# Patient Record
Sex: Female | Born: 1960
Health system: Southern US, Community
[De-identification: ages and names within clinical notes are randomized; demographics above are authoritative.]

## PROBLEM LIST (undated history)

## (undated) DIAGNOSIS — E079 Disorder of thyroid, unspecified: Secondary | ICD-10-CM

## (undated) DIAGNOSIS — E119 Type 2 diabetes mellitus without complications: Secondary | ICD-10-CM

## (undated) DIAGNOSIS — E785 Hyperlipidemia, unspecified: Secondary | ICD-10-CM

## (undated) DIAGNOSIS — O009 Unspecified ectopic pregnancy without intrauterine pregnancy: Secondary | ICD-10-CM

## (undated) DIAGNOSIS — E039 Hypothyroidism, unspecified: Secondary | ICD-10-CM

## (undated) HISTORY — PX: NOSE SURGERY: SHX723

## (undated) HISTORY — DX: Hypothyroidism, unspecified: E03.9

## (undated) HISTORY — DX: Disorder of thyroid, unspecified: E07.9

## (undated) HISTORY — DX: Type 2 diabetes mellitus without complications: E11.9

## (undated) HISTORY — PX: COLONOSCOPY: SHX174

## (undated) HISTORY — DX: Unspecified ectopic pregnancy without intrauterine pregnancy: O00.90

## (undated) HISTORY — DX: Hyperlipidemia, unspecified: E78.5

## (undated) HISTORY — PX: RHINOPLASTY: SUR1284

## (undated) HISTORY — PX: TUBAL LIGATION: SHX77

## (undated) HISTORY — PX: PELVIC LAPAROSCOPY: SHX162

---

## 1997-09-08 ENCOUNTER — Other Ambulatory Visit: Admission: RE | Admit: 1997-09-08 | Discharge: 1997-09-08 | Payer: Self-pay | Admitting: Gynecology

## 1998-02-12 ENCOUNTER — Inpatient Hospital Stay (HOSPITAL_COMMUNITY): Admission: AD | Admit: 1998-02-12 | Discharge: 1998-02-12 | Payer: Self-pay | Admitting: Obstetrics and Gynecology

## 1998-02-14 ENCOUNTER — Observation Stay (HOSPITAL_COMMUNITY): Admission: AD | Admit: 1998-02-14 | Discharge: 1998-02-14 | Payer: Self-pay | Admitting: Obstetrics and Gynecology

## 1998-10-26 ENCOUNTER — Emergency Department (HOSPITAL_COMMUNITY): Admission: EM | Admit: 1998-10-26 | Discharge: 1998-10-26 | Payer: Self-pay | Admitting: *Deleted

## 1999-06-21 ENCOUNTER — Other Ambulatory Visit: Admission: RE | Admit: 1999-06-21 | Discharge: 1999-06-21 | Payer: Self-pay | Admitting: Gynecology

## 2000-10-26 ENCOUNTER — Other Ambulatory Visit: Admission: RE | Admit: 2000-10-26 | Discharge: 2000-10-26 | Payer: Self-pay | Admitting: Gynecology

## 2003-03-26 ENCOUNTER — Other Ambulatory Visit: Admission: RE | Admit: 2003-03-26 | Discharge: 2003-03-26 | Payer: Self-pay | Admitting: Gynecology

## 2009-04-08 ENCOUNTER — Ambulatory Visit: Payer: Self-pay | Admitting: Gynecology

## 2009-04-08 ENCOUNTER — Other Ambulatory Visit: Admission: RE | Admit: 2009-04-08 | Discharge: 2009-04-08 | Payer: Self-pay | Admitting: Gynecology

## 2010-05-24 ENCOUNTER — Emergency Department (HOSPITAL_COMMUNITY)
Admission: EM | Admit: 2010-05-24 | Discharge: 2010-05-24 | Payer: Self-pay | Source: Home / Self Care | Admitting: Emergency Medicine

## 2010-05-24 LAB — URINALYSIS, ROUTINE W REFLEX MICROSCOPIC
Bilirubin Urine: NEGATIVE
Ketones, ur: NEGATIVE mg/dL
Protein, ur: 30 mg/dL — AB
Urine Glucose, Fasting: NEGATIVE mg/dL

## 2010-05-24 LAB — POCT PREGNANCY, URINE: Preg Test, Ur: NEGATIVE

## 2010-05-24 LAB — URINE MICROSCOPIC-ADD ON

## 2010-05-27 LAB — URINE CULTURE
Colony Count: >=100000
Culture  Setup Time: 201202011127

## 2012-03-15 ENCOUNTER — Ambulatory Visit (INDEPENDENT_AMBULATORY_CARE_PROVIDER_SITE_OTHER): Payer: 59

## 2012-03-15 ENCOUNTER — Ambulatory Visit (INDEPENDENT_AMBULATORY_CARE_PROVIDER_SITE_OTHER): Payer: 59 | Admitting: Women's Health

## 2012-03-15 ENCOUNTER — Encounter: Payer: Self-pay | Admitting: Women's Health

## 2012-03-15 DIAGNOSIS — E039 Hypothyroidism, unspecified: Secondary | ICD-10-CM

## 2012-03-15 DIAGNOSIS — R1031 Right lower quadrant pain: Secondary | ICD-10-CM

## 2012-03-15 NOTE — Progress Notes (Addendum)
Patient ID: MILAYAH KRELL, female   DOB: [birth year], 51 y.o.   MRN: [id number] Presents with complaint of right lower quadrant pain started yesterday increased this a.m. and is less now. States slept well last night. States the pain with stabbing and intense earlier. Denies fever, nausea/ vomiting, change in bowel elimination or urinary symptoms. Cycles are monthly, currently on cycle, reports as heavier. New partner/widowed.  Exam: No CVAT, abdomen obese, no tenderness with palpation, no radiation, rebound noted. External genitalia slightly erythematous, speculum exam copious menses noted without odor GC/Chlamydia culture taken. Bimanual no CMT or tenderness. Ultrasound: 2 small fibroids 14 x 9 mm, 9 x 7 mm. Ovaries appear normal. No free fluid. Tolerated exam well.  Resolving right lower quadrant pain  Plan: Motrin as needed for pain. Increase rest, note written to miss work [employer], works 7 p to 7 a. Overdue for annual exam instructed to schedule with Dr. Audie Box. Followup if pain persists. Reviewed scar tissue type pain also possible from history 3 ectopic pregnancies.

## 2012-03-16 LAB — GC/CHLAMYDIA PROBE AMP, GENITAL: Chlamydia, DNA Probe: NEGATIVE

## 2012-04-02 ENCOUNTER — Ambulatory Visit (INDEPENDENT_AMBULATORY_CARE_PROVIDER_SITE_OTHER): Payer: 59 | Admitting: Gynecology

## 2012-04-02 ENCOUNTER — Encounter: Payer: Self-pay | Admitting: Gynecology

## 2012-04-02 ENCOUNTER — Other Ambulatory Visit (HOSPITAL_COMMUNITY)
Admission: RE | Admit: 2012-04-02 | Discharge: 2012-04-02 | Disposition: A | Discharge status: Home / Self Care | Payer: 59 | Source: Ambulatory Visit | Attending: Gynecology | Admitting: Gynecology

## 2012-04-02 VITALS — BP 124/78 | Ht 65.5 in | Wt 240.0 lb

## 2012-04-02 DIAGNOSIS — E039 Hypothyroidism, unspecified: Secondary | ICD-10-CM

## 2012-04-02 DIAGNOSIS — Z13220 Encounter for screening for lipoid disorders: Secondary | ICD-10-CM

## 2012-04-02 DIAGNOSIS — Z01419 Encounter for gynecological examination (general) (routine) without abnormal findings: Secondary | ICD-10-CM | POA: Insufficient documentation

## 2012-04-02 DIAGNOSIS — N946 Dysmenorrhea, unspecified: Secondary | ICD-10-CM

## 2012-04-02 DIAGNOSIS — Z1151 Encounter for screening for human papillomavirus (HPV): Secondary | ICD-10-CM | POA: Insufficient documentation

## 2012-04-02 LAB — CBC WITH DIFFERENTIAL/PLATELET
Basophils Relative: 0 % (ref 0–1)
Eosinophils Absolute: 0.2 10*3/uL (ref 0.0–0.7)
Hemoglobin: 11.6 g/dL — ABNORMAL LOW (ref 12.0–15.0)
Lymphs Abs: 2.5 10*3/uL (ref 0.7–4.0)
MCH: 24.4 pg — ABNORMAL LOW (ref 26.0–34.0)
MCHC: 32 g/dL (ref 30.0–36.0)
Monocytes Relative: 7 % (ref 3–12)
Neutro Abs: 5.9 10*3/uL (ref 1.7–7.7)
Neutrophils Relative %: 63 % (ref 43–77)
Platelets: 350 10*3/uL (ref 150–400)
RBC: 4.75 MIL/uL (ref 3.87–5.11)

## 2012-04-02 LAB — LIPID PANEL
LDL Cholesterol: 90 mg/dL (ref 0–99)
VLDL: 31 mg/dL (ref 0–40)

## 2012-04-02 LAB — COMPREHENSIVE METABOLIC PANEL
ALT: 20 U/L (ref 0–35)
Albumin: 3.9 g/dL (ref 3.5–5.2)
Alkaline Phosphatase: 65 U/L (ref 39–117)
Glucose, Bld: 82 mg/dL (ref 70–99)
Potassium: 4.3 mEq/L (ref 3.5–5.3)
Sodium: 138 mEq/L (ref 135–145)
Total Bilirubin: 0.4 mg/dL (ref 0.3–1.2)
Total Protein: 6.6 g/dL (ref 6.0–8.3)

## 2012-04-02 MED ORDER — IBUPROFEN 800 MG PO TABS: 800.0000 mg | ORAL_TABLET | Freq: Three times a day (TID) | ORAL | Status: DC | PRN

## 2012-04-02 NOTE — Addendum Note (Signed)
Addended by: Richardson Chiquito on: 04/02/2012 03:56 PM   Modules accepted: Orders

## 2012-04-02 NOTE — Progress Notes (Signed)
Elizabeth Farley 02-03-1961 [id number]        51 y.o.  G5P0050 for annual exam.  Several issues noted below.  Past medical history,surgical history, medications, allergies, family history and social history were all reviewed and documented in the EPIC chart. ROS:  Was performed and pertinent positives and negatives are included in the history.  Exam: Sherrilyn Rist assistant Filed Vitals:   04/02/12 1506  BP: 124/78  Height: 5' 5.5" (1.664 m)  Weight: 240 lb (108.863 kg)   General appearance  Normal Skin grossly normal Head/Neck normal with no cervical or supraclavicular adenopathy thyroid normal Lungs  clear Cardiac RR, without RMG Abdominal  soft, nontender, without masses, organomegaly or hernia Breasts  examined lying and sitting without masses, retractions, discharge or axillary adenopathy. Pelvic  Ext/BUS/vagina  normal   Cervix  normal Pap/HPV  Uterus  anteverted, normal size, shape and contour, midline and mobile nontender   Adnexa  Without masses or tenderness    Anus and perineum  normal   Rectovaginal  normal sphincter tone without palpated masses or tenderness.    Assessment/Plan:  51 y.o. G64P0050 female for annual exam, regular menses, tubal sterilization.   1. Last menses a little heavier, longer was a lot more dysmenorrhea. Otherwise regular menses.  Recent ultrasound November 2013 by Harriett Sine for right lower quadrant pain should 2 small myomas otherwise normal. No said her pain from then has disappeared. Check baseline FSH TSH otherwise keep menstrual calendar as long as normal regular menses we'll follow. If irregularity continues will represent for evaluation.  Motrin 800 mg #30 with 2 refill in the event that she has recurrent dysmenorrhea. 2. Mammogram 08/2010. Patient overdo and knows to schedule an agrees to do so. SBE monthly reviewed. 3. Pap smear 2010. Pap/HPV done today. No history of abnormal Pap smears previously. 4. Hypothyroid. Check a TSH today. 5. Health  maintenance. Baseline CBC urinalysis comprehensive metabolic panel lipid profile ordered with above blood work. Follow up as noted above. Otherwise annually    Dara Lords MD, 3:31 PM 04/02/2012

## 2012-04-02 NOTE — Patient Instructions (Signed)
Call to Schedule your mammogram  Facilities in [place]: 1)  [facility] [place], [place] Phone: [phone] 2)  [facility], [phone] Phone: [phone])  Dr. Yolanda Bonine at [facility] [address] Phone: [phone]     Mammogram A mammogram is an X-ray test to find changes in a woman's breast. You should get a mammogram if:  You are 38 years of age or older  You have risk factors.   Your doctor recommends that you have one.  BEFORE THE TEST  Do not schedule the test the week before your period, especially if your breasts are sore during this time.  On the day of your mammogram:  Wash your breasts and armpits well. After washing, do not put on any deodorant or talcum powder on until after your test.   Eat and drink as you usually do.   Take your medicines as usual.   If you are diabetic and take insulin, make sure you:   Eat before coming for your test.   Take your insulin as usual.   If you cannot keep your appointment, call before the appointment to cancel. Schedule another appointment.  TEST  You will need to undress from the waist up. You will put on a hospital gown.   Your breast will be put on the mammogram machine, and it will press firmly on your breast with a piece of plastic called a compression paddle. This will make your breast flatter so that the machine can X-ray all parts of your breast.   Both breasts will be X-rayed. Each breast will be X-rayed from above and from the side. An X-ray might need to be taken again if the picture is not good enough.   The mammogram will last about 15 to 30 minutes.  AFTER THE TEST Finding out the results of your test Ask when your test results will be ready. Make sure you get your test results.  Document Released: 07/07/2008 Document Revised: 03/30/2011 Document Reviewed: 07/07/2008 [facility] Patient  Information 2012 [place], [place].  Call if irregular menses continue. Follow up in one year for annual exam.

## 2012-04-03 ENCOUNTER — Telehealth: Payer: Self-pay | Admitting: Gynecology

## 2012-04-03 LAB — URINALYSIS W MICROSCOPIC + REFLEX CULTURE
Bacteria, UA: NONE SEEN
Bilirubin Urine: NEGATIVE
Glucose, UA: NEGATIVE mg/dL
Ketones, ur: NEGATIVE mg/dL
Protein, ur: NEGATIVE mg/dL
Urobilinogen, UA: 0.2 mg/dL (ref 0.0–1.0)

## 2012-04-03 LAB — FOLLICLE STIMULATING HORMONE: FSH: 2.6 m[IU]/mL

## 2012-04-03 LAB — TSH: TSH: 7.37 u[IU]/mL — ABNORMAL HIGH (ref 0.350–4.500)

## 2012-04-03 MED ORDER — LEVOTHYROXINE SODIUM 75 MCG PO TABS: 75.0000 ug | ORAL_TABLET | Freq: Every day | ORAL | Status: DC

## 2012-04-03 NOTE — Telephone Encounter (Signed)
Recommend she start on Synthroid 75 mcg daily and recheck TSH after 4-6 weeks. If she develops symptoms such as jitteriness, rapid heartbeat, shortness of breath, insomnia she needs to call.

## 2012-04-03 NOTE — Telephone Encounter (Signed)
Tell patient: 1. Hemoglobin is a little low I want her to start on a multivitamin with iron daily. I think this is due to her bleeding. 2. Her TSH shows she is hypothyroid. He she currently taking thyroid replacement? If not, what was we she taking before as we need to restart her on thyroid replacement.

## 2012-04-03 NOTE — Telephone Encounter (Signed)
Patient was informed.  Patient said over a year since she last took her thyroid medication.  She thinks it was" .25 or 25".

## 2012-04-04 ENCOUNTER — Other Ambulatory Visit: Payer: Self-pay | Admitting: Gynecology

## 2012-04-04 DIAGNOSIS — E039 Hypothyroidism, unspecified: Secondary | ICD-10-CM

## 2012-04-04 NOTE — Telephone Encounter (Signed)
Patient informed. 

## 2012-05-09 ENCOUNTER — Other Ambulatory Visit: Payer: 59

## 2012-05-09 DIAGNOSIS — E039 Hypothyroidism, unspecified: Secondary | ICD-10-CM

## 2012-05-09 LAB — TSH: TSH: 3.187 u[IU]/mL (ref 0.350–4.500)

## 2012-05-13 ENCOUNTER — Other Ambulatory Visit: Payer: Self-pay | Admitting: Gynecology

## 2012-05-13 DIAGNOSIS — E039 Hypothyroidism, unspecified: Secondary | ICD-10-CM

## 2014-02-23 ENCOUNTER — Encounter: Payer: Self-pay | Admitting: Gynecology

## 2014-07-09 ENCOUNTER — Encounter: Payer: Self-pay | Admitting: Gynecology

## 2014-07-09 ENCOUNTER — Ambulatory Visit (INDEPENDENT_AMBULATORY_CARE_PROVIDER_SITE_OTHER): Payer: 59 | Admitting: Gynecology

## 2014-07-09 VITALS — BP 124/78

## 2014-07-09 DIAGNOSIS — N921 Excessive and frequent menstruation with irregular cycle: Secondary | ICD-10-CM

## 2014-07-09 LAB — CBC WITH DIFFERENTIAL/PLATELET
BASOS ABS: 0.1 10*3/uL (ref 0.0–0.1)
BASOS PCT: 1 % (ref 0–1)
Eosinophils Absolute: 0.2 10*3/uL (ref 0.0–0.7)
Eosinophils Relative: 3 % (ref 0–5)
HCT: 40.3 % (ref 36.0–46.0)
HEMOGLOBIN: 12.9 g/dL (ref 12.0–15.0)
LYMPHS ABS: 2.1 10*3/uL (ref 0.7–4.0)
Lymphocytes Relative: 25 % (ref 12–46)
MCH: 25.4 pg — ABNORMAL LOW (ref 26.0–34.0)
MCHC: 32 g/dL (ref 30.0–36.0)
MCV: 79.3 fL (ref 78.0–100.0)
MONOS PCT: 6 % (ref 3–12)
MPV: 10.2 fL (ref 8.6–12.4)
Monocytes Absolute: 0.5 10*3/uL (ref 0.1–1.0)
NEUTROS ABS: 5.3 10*3/uL (ref 1.7–7.7)
NEUTROS PCT: 65 % (ref 43–77)
PLATELETS: 383 10*3/uL (ref 150–400)
RBC: 5.08 MIL/uL (ref 3.87–5.11)
RDW: 16.4 % — AB (ref 11.5–15.5)
WBC: 8.2 10*3/uL (ref 4.0–10.5)

## 2014-07-09 LAB — TSH: TSH: 4.066 u[IU]/mL (ref 0.350–4.500)

## 2014-07-09 MED ORDER — MEGESTROL ACETATE 20 MG PO TABS: 20.0000 mg | ORAL_TABLET | Freq: Every day | ORAL | Status: DC

## 2014-07-09 NOTE — Progress Notes (Signed)
Elizabeth Farley November 08, 1960 [id number]        54 y.o.  [id number] presents with a history of regular monthly menses until the beginning of February when she was due to start. She started spotting and spotted daily through yesterday when she started to bleed heavy with passage of clots and cramping. No bleeding in between or skips previously. No hot flushes, night sweats, vaginal dryness. No skin changes hair changes or weight changes. Is status post tubal sterilization in the past.  Past medical history,surgical history, problem list, medications, allergies, family history and social history were all reviewed and documented in the EPIC chart.  Directed ROS with pertinent positives and negatives documented in the history of present illness/assessment and plan.  [name] assistant Filed Vitals:   07/09/14 0934  BP: 124/78   General appearance:  Normal Abdomen soft nontender without masses guarding rebound Pelvic external BUS vagina with moderate menses flow. Cervix normal. Uterus grossly normal midline mobile nontender. Adnexa without masses or tenderness.  Assessment/Plan:  54 y.o. [id number] with dysfunctional bleeding in the peri-menopausal age group. Check baseline CBC, TSH, FSH. Schedule sonohysterogram to rule out nonpalpable abnormalities. Megace 20 mg twice a day times several days until bleeding slows then daily through ultrasound appointment. Follow up sooner if bleeding increases or other issues. Differential discussed with the patient to include dysfunctional bleeding with perimenopausal ovulatory irregularity versus structural abnormalities such as leiomyoma/polyps.     [name] MD, 9:48 AM 07/09/2014

## 2014-07-09 NOTE — Patient Instructions (Signed)
Take the Megace medication twice daily for several days until bleeding slows them once daily until the ultrasound appointment. Call sooner if bleeding becomes worse.

## 2014-07-10 ENCOUNTER — Other Ambulatory Visit: Payer: Self-pay | Admitting: Gynecology

## 2014-07-10 DIAGNOSIS — N921 Excessive and frequent menstruation with irregular cycle: Secondary | ICD-10-CM

## 2014-07-10 LAB — FOLLICLE STIMULATING HORMONE: FSH: 11.2 m[IU]/mL

## 2014-07-20 ENCOUNTER — Telehealth: Payer: Self-pay | Admitting: Gynecology

## 2014-07-20 NOTE — Telephone Encounter (Signed)
07/21/14-I LM VM for pt that I check benefits for the Sonohysterogram with her [facility] [employer] ins and they said covered at 100%, no copay or deductible/wl

## 2014-07-24 ENCOUNTER — Encounter: Payer: Self-pay | Admitting: Gynecology

## 2014-07-24 ENCOUNTER — Ambulatory Visit (INDEPENDENT_AMBULATORY_CARE_PROVIDER_SITE_OTHER): Payer: 59

## 2014-07-24 ENCOUNTER — Ambulatory Visit (INDEPENDENT_AMBULATORY_CARE_PROVIDER_SITE_OTHER): Payer: 59 | Admitting: Gynecology

## 2014-07-24 VITALS — BP 124/80

## 2014-07-24 DIAGNOSIS — N921 Excessive and frequent menstruation with irregular cycle: Secondary | ICD-10-CM | POA: Diagnosis not present

## 2014-07-24 DIAGNOSIS — N926 Irregular menstruation, unspecified: Secondary | ICD-10-CM

## 2014-07-24 DIAGNOSIS — N840 Polyp of corpus uteri: Secondary | ICD-10-CM

## 2014-07-24 NOTE — Patient Instructions (Signed)
Office will call you with biopsy results Office will call you to arrange for surgery

## 2014-07-24 NOTE — Progress Notes (Signed)
Elizabeth Farley Nov 19, 1960 [id number]        54 y.o.  [id number] presents for sonohysterogram due to irregular heavy bleeding. She was started on Megace which has stopped her bleeding. FSH was 11, TSH normal, hemoglobin 12.9.  Past medical history,surgical history, problem list, medications, allergies, family history and social history were all reviewed and documented in the EPIC chart.  Directed ROS with pertinent positives and negatives documented in the history of present illness/assessment and plan.  Exam: Sallee Lange assistant Filed Vitals:   07/24/14 1452  BP: 124/80   General appearance:  Normal Abdomen soft nontender without masses guarding rebound External BUS vagina normal. Cervix normal. Uterus grossly normal midline mobile. Adnexa without gross masses or tenderness  Ultrasound shows uterus normal size and echotexture.Several small myomas measuring 18 mm, 13 mm, 11 mm. Endometrial echo 13 mm. Right ovary normal. Left ovary with echo-free thin-walled avascular cyst 18 x 9 x 11 mm.  Sonohysterogram performed, sterile technique, easy catheter introduction, good distention with 10 x 9 x 10 mm right fundal polyp. Endometrial sample taken. Patient tolerated well.  Assessment/Plan:  54 y.o. [id number] with irregular heavy menses now stopped with Megace. Ultrasound shows multiple small myomas. Sonohysterogram shows clear fundal endometrial polyp.I reviewed the findings with the patient my recommendation to proceed with hysteroscopy D&C with resection of the endometrial polyp. I reviewed what is involved with the procedure to include the intraoperative and postoperative courses as well as the recovery period. Instrumentation including hysteroscopy resectoscope D&C portion were reviewed. The risks to include infection, prolonged antibiotics, hemorrhage necessitating transfusion and the risks of transfusion reviewed to include transfusion reaction, hepatitis, HIV, mad cow disease and other unknown  entities. The risk of uterine perforation and damage to internal organs including bowel, bladder, ureters, vessels, nerves either immediately recognized or delay recognized necessitating major exploratory reparative surgery and future reparative surgery including bowel resection bladder repair ureteral damage repair and ostomy formation was discussed. Distended media absorption leading to metabolic complications such as fluid overload, coma, seizures was all discussed. I did review with her that she is age 23 in the perimenopausal time frame and that there are no guarantees that this will resolve her irregular bleeding and then she may continue to bleed irregularly secondary to her perimenopausal status and that we may have to deal with this is a separate issue as time moves forward.  The patient's questions were answered to her satisfaction and she'll follow up to schedule. She is due for annual exam and we will do this preoperatively.     [name] MD, 3:20 PM 07/24/2014

## 2014-07-27 ENCOUNTER — Telehealth: Payer: Self-pay

## 2014-07-27 ENCOUNTER — Other Ambulatory Visit: Payer: Self-pay | Admitting: Gynecology

## 2014-07-27 MED ORDER — MISOPROSTOL 200 MCG PO TABS: ORAL_TABLET | ORAL | Status: DC

## 2014-07-27 NOTE — Telephone Encounter (Signed)
Spoke with patient and discussed ins benefits and estimated financial responsibility to [facility] preop.  Letter will be sent to her detailing this as well. Scheduled her for 08/18/14 at [facility].  CE was moved to before surgery and will serve as preop per Dr. Loetta Rough.  Discussed with patient the need for Cytotec vaginally hs before surgery and Rx was sent.

## 2014-07-28 ENCOUNTER — Encounter: Payer: Self-pay | Admitting: Gynecology

## 2014-08-04 ENCOUNTER — Telehealth: Payer: Self-pay

## 2014-08-04 NOTE — Telephone Encounter (Signed)
Left detailed message in patient's vmail.

## 2014-08-04 NOTE — Telephone Encounter (Signed)
Patient has her RGCE/pre op appt with you on Friday, 4/15 and Hyst D&C scheduled on 08/18/14.  She is still spotting/light bleeding and wondered what she should do? She's been on Megace in the past and wondered if she needed that?

## 2014-08-04 NOTE — Telephone Encounter (Signed)
I will see her on Friday and we'll talk about if she still bleeding.

## 2014-08-07 ENCOUNTER — Other Ambulatory Visit (HOSPITAL_COMMUNITY)
Admission: RE | Admit: 2014-08-07 | Discharge: 2014-08-07 | Disposition: A | Discharge status: Home / Self Care | Payer: 59 | Source: Ambulatory Visit | Attending: Gynecology | Admitting: Gynecology

## 2014-08-07 ENCOUNTER — Encounter (HOSPITAL_COMMUNITY)
Admission: RE | Admit: 2014-08-07 | Discharge: 2014-08-07 | Disposition: A | Discharge status: Home / Self Care | Payer: 59 | Source: Ambulatory Visit | Attending: Gynecology | Admitting: Gynecology

## 2014-08-07 ENCOUNTER — Encounter: Payer: Self-pay | Admitting: Gynecology

## 2014-08-07 ENCOUNTER — Ambulatory Visit (INDEPENDENT_AMBULATORY_CARE_PROVIDER_SITE_OTHER): Payer: 59 | Admitting: Gynecology

## 2014-08-07 ENCOUNTER — Encounter (HOSPITAL_COMMUNITY): Payer: Self-pay

## 2014-08-07 VITALS — BP 124/76 | Ht 66.0 in | Wt 250.0 lb

## 2014-08-07 DIAGNOSIS — N840 Polyp of corpus uteri: Secondary | ICD-10-CM

## 2014-08-07 DIAGNOSIS — Z01419 Encounter for gynecological examination (general) (routine) without abnormal findings: Secondary | ICD-10-CM | POA: Insufficient documentation

## 2014-08-07 DIAGNOSIS — N926 Irregular menstruation, unspecified: Secondary | ICD-10-CM

## 2014-08-07 DIAGNOSIS — Z01818 Encounter for other preprocedural examination: Secondary | ICD-10-CM | POA: Insufficient documentation

## 2014-08-07 LAB — CBC
HCT: 36.4 % (ref 36.0–46.0)
Hemoglobin: 11.7 g/dL — ABNORMAL LOW (ref 12.0–15.0)
MCH: 25.5 pg — ABNORMAL LOW (ref 26.0–34.0)
MCHC: 32.1 g/dL (ref 30.0–36.0)
MCV: 79.3 fL (ref 78.0–100.0)
PLATELETS: 324 10*3/uL (ref 150–400)
RBC: 4.59 MIL/uL (ref 3.87–5.11)
RDW: 16.2 % — ABNORMAL HIGH (ref 11.5–15.5)
WBC: 13.6 10*3/uL — AB (ref 4.0–10.5)

## 2014-08-07 LAB — COMPREHENSIVE METABOLIC PANEL
ALT: 23 U/L (ref 0–35)
AST: 20 U/L (ref 0–37)
Albumin: 3.7 g/dL (ref 3.5–5.2)
Alkaline Phosphatase: 74 U/L (ref 39–117)
BUN: 7 mg/dL (ref 6–23)
CHLORIDE: 104 meq/L (ref 96–112)
CO2: 24 meq/L (ref 19–32)
CREATININE: 0.72 mg/dL (ref 0.50–1.10)
Calcium: 9 mg/dL (ref 8.4–10.5)
Glucose, Bld: 87 mg/dL (ref 70–99)
POTASSIUM: 4.5 meq/L (ref 3.5–5.3)
Sodium: 136 mEq/L (ref 135–145)
Total Bilirubin: 0.4 mg/dL (ref 0.2–1.2)
Total Protein: 6.6 g/dL (ref 6.0–8.3)

## 2014-08-07 LAB — LIPID PANEL
CHOLESTEROL: 147 mg/dL (ref 0–200)
HDL: 38 mg/dL — AB (ref >=46)
LDL CALC: 87 mg/dL (ref 0–99)
TRIGLYCERIDES: 111 mg/dL (ref <150)
Total CHOL/HDL Ratio: 3.9 Ratio
VLDL: 22 mg/dL (ref 0–40)

## 2014-08-07 MED ORDER — MEGESTROL ACETATE 20 MG PO TABS: 20.0000 mg | ORAL_TABLET | Freq: Every day | ORAL | Status: DC

## 2014-08-07 NOTE — Progress Notes (Signed)
Elizabeth Farley 11-May-1960 [id number]        54 y.o.  [id number] for annual exam.  Several issues noted below.  Past medical history,surgical history, problem list, medications, allergies, family history and social history were all reviewed and documented as reviewed in the EPIC chart.  ROS:  Performed with pertinent positives and negatives included in the history, assessment and plan.   Additional significant findings :  none   Exam: [name] Counsellor Vitals:   08/07/14 0947  BP: 124/76  Height: 5\' 6"  (1.676 m)  Weight: 250 lb (113.399 kg)   General appearance:  Normal affect, orientation and appearance. Skin: Grossly normal HEENT: Without gross lesions.  No cervical or supraclavicular adenopathy. Thyroid normal.  Lungs:  Clear without wheezing, rales or rhonchi Cardiac: RR, without RMG Abdominal:  Soft, nontender, without masses, guarding, rebound, organomegaly or hernia Breasts:  Examined lying and sitting without masses, retractions, discharge or axillary adenopathy. Pelvic:  Ext/BUS/vagina with menses type flow  Cervix normal. Pap smear done  Uterus axial to anteverted, difficult to clearly palpate but grossly normal size, midline and mobile nontender   Adnexa  Without gross masses or tenderness    Anus and perineum  Normal   Rectovaginal  Normal sphincter tone without palpated masses or tenderness.    Assessment/Plan:  54 y.o. G28P0050 female for annual exam.   1. Irregular menses. Recently was evaluated for irregular bleeding with normal FSH TSH and hemoglobin of 12. Ultrasound showed several small myomas measuring 18, 13 and 11 mm. Sonohysterogram showed a 10 x 9 x 10 mm right fundal polyp. Endometrial biopsy was negative. Patient is scheduled for hysteroscopy D&C with resection of her endometrial polyp 26 April.  I have reviewed what is involved with the procedure to include the intraoperative and postoperative courses as well as the recovery period. Instrumentation including  hysteroscopy, resectoscope, D&C portion were reviewed. The risks to include infection, prolonged antibiotics, hemorrhage necessitating transfusion and the risks of transfusion reviewed to include transfusion reaction, hepatitis, HIV, mad cow disease and other unknown entities. The risk of uterine perforation and damage to internal organs including bowel, bladder, ureters, vessels, nerves either immediately recognized or delay recognized necessitating major exploratory reparative surgery and future reparative surgery including bowel resection bladder repair ureteral damage repair and ostomy formation was discussed. Distended media absorption leading to metabolic complications such as fluid overload, coma, seizures was all discussed. I did review with her that she is age 63 in the perimenopausal time frame and that there are no guarantees that this will resolve her irregular bleeding and then she may continue to bleed irregularly secondary to her perimenopausal status and that we may have to deal with this is a separate issue as time moves forward.  The patient's questions were answered to her satisfaction and she'll follow up to schedule.   She is continuing to bleed irregularly I placed her on Megace 20 mg daily through the procedure to hopefully slow down/stop her bleeding. 2. Pap smear/HPV negative 2013. Pap smear done today. No history of significant abnormal Pap smears. 3. Mammography overdue by several years. Strongly recommended patient schedule when she agrees to do so. Names and numbers provided. SBE monthly reviewed. 4. Colonoscopy never. Recommended screening colonoscopy as she has turned 64 and she acknowledges my recommendation. 5. Health maintenance. Has not had blood work done in some time. Baseline comprehensive metabolic panel, lipid profile, urinalysis, vitamin D ordered. Recent CBC and TSH were normal. Follow up for surgery otherwise 1  year for annual exam.     [name] MD, 10:21  AM 08/07/2014

## 2014-08-07 NOTE — Patient Instructions (Addendum)
   Your procedure is scheduled on: April 26 at [place] through the [facility] at: [place] up the phone at the desk and dial [phone] and inform us of your arrival.  Please call this number if you have any problems the morning of surgery: [phone]  Remember: Do not eat food or drink liquids after midnight: Monday ( April 25 )   Take these medicines the morning of surgery with a SIP OF WATER:  Do not wear jewelry or, make-up. No metal in your hair or on your body. Do not wear lotions, powders, perfumes.  You may wear deodorant.  Do not bring valuables to the hospital. Contacts, dentures or bridgework may not be worn into surgery.     Patients discharged on the day of surgery will not be allowed to drive home.

## 2014-08-07 NOTE — Patient Instructions (Signed)
Follow up for surgery as scheduled.  Call to Schedule your mammogram  Facilities in [place]: 1)  [facility], [place] Phone: [phone] 2)  [facility]. [address] [employer] [place] Phone: [phone] 3)  Dr. Isaiah Blakes at [facility] [address] Phone: [phone]     Mammogram A mammogram is an X-ray test to find changes in a woman's breast. You should get a mammogram if:  You are 60 years of age or older  You have risk factors.   Your doctor recommends that you have one.  BEFORE THE TEST  Do not schedule the test the week before your period, especially if your breasts are sore during this time.  On the day of your mammogram:  Wash your breasts and armpits well. After washing, do not put on any deodorant or talcum powder on until after your test.   Eat and drink as you usually do.   Take your medicines as usual.   If you are diabetic and take insulin, make sure you:   Eat before coming for your test.   Take your insulin as usual.   If you cannot keep your appointment, call before the appointment to cancel. Schedule another appointment.  TEST  You will need to undress from the waist up. You will put on a hospital gown.   Your breast will be put on the mammogram machine, and it will press firmly on your breast with a piece of plastic called a compression paddle. This will make your breast flatter so that the machine can X-ray all parts of your breast.   Both breasts will be X-rayed. Each breast will be X-rayed from above and from the side. An X-ray might need to be taken again if the picture is not good enough.   The mammogram will last about 15 to 30 minutes.  AFTER THE TEST Finding out the results of your test Ask when your test results will be ready. Make sure you get your test results.  Document Released: 07/07/2008 Document Revised: 03/30/2011 Document  Reviewed: 07/07/2008 [facility] Patient Information 2012 [place].  You may obtain a copy of any labs that were done today by logging onto MyChart as outlined in the instructions provided with your AVS (after visit summary). The office will not call with normal lab results but certainly if there are any significant abnormalities then we will contact you.   Health Maintenance, Female A healthy lifestyle and preventative care can promote health and wellness.  Maintain regular health, dental, and eye exams.  Eat a healthy diet. Foods like vegetables, fruits, whole grains, low-fat dairy products, and lean protein foods contain the nutrients you need without too many calories. Decrease your intake of foods high in solid fats, added sugars, and salt. Get information about a proper diet from your caregiver, if necessary.  Regular physical exercise is one of the most important things you can do for your health. Most adults should get at least 150 minutes of moderate-intensity exercise (any activity that increases your heart rate and causes you to sweat) each week. In addition, most adults need muscle-strengthening exercises on 2 or more days a week.   Maintain a healthy weight. The body mass index (BMI) is a screening tool to identify possible weight problems. It provides an estimate of body fat based on height and weight. Your caregiver can help determine your BMI, and can help you achieve  or maintain a healthy weight. For adults 20 years and older:  A BMI below 18.5 is considered underweight.  A BMI of 18.5 to 24.9 is normal.  A BMI of 25 to 29.9 is considered overweight.  A BMI of 30 and above is considered obese.  Maintain normal blood lipids and cholesterol by exercising and minimizing your intake of saturated fat. Eat a balanced diet with plenty of fruits and vegetables. Blood tests for lipids and cholesterol should begin at age 36 and be repeated every 5 years. If your lipid or  cholesterol levels are high, you are over 50, or you are a high risk for heart disease, you may need your cholesterol levels checked more frequently.Ongoing high lipid and cholesterol levels should be treated with medicines if diet and exercise are not effective.  If you smoke, find out from your caregiver how to quit. If you do not use tobacco, do not start.  Lung cancer screening is recommended for adults aged 36 80 years who are at high risk for developing lung cancer because of a history of smoking. Yearly low-dose computed tomography (CT) is recommended for people who have at least a 30-pack-year history of smoking and are a current smoker or have quit within the past 15 years. A pack year of smoking is smoking an average of 1 pack of cigarettes a day for 1 year (for example: 1 pack a day for 30 years or 2 packs a day for 15 years). Yearly screening should continue until the smoker has stopped smoking for at least 15 years. Yearly screening should also be stopped for people who develop a health problem that would prevent them from having lung cancer treatment.  If you are pregnant, do not drink alcohol. If you are breastfeeding, be very cautious about drinking alcohol. If you are not pregnant and choose to drink alcohol, do not exceed 1 drink per day. One drink is considered to be 12 ounces (355 mL) of beer, 5 ounces (148 mL) of wine, or 1.5 ounces (44 mL) of liquor.  Avoid use of street drugs. Do not share needles with anyone. Ask for help if you need support or instructions about stopping the use of drugs.  High blood pressure causes heart disease and increases the risk of stroke. Blood pressure should be checked at least every 1 to 2 years. Ongoing high blood pressure should be treated with medicines, if weight loss and exercise are not effective.  If you are 83 to 52 years old, ask your caregiver if you should take aspirin to prevent strokes.  Diabetes screening involves taking a blood sample  to check your fasting blood sugar level. This should be done once every 3 years, after age 26, if you are within normal weight and without risk factors for diabetes. Testing should be considered at a younger age or be carried out more frequently if you are overweight and have at least 1 risk factor for diabetes.  Breast cancer screening is essential preventative care for women. You should practice "breast self-awareness." This means understanding the normal appearance and feel of your breasts and may include breast self-examination. Any changes detected, no matter how small, should be reported to a caregiver. Women in their 70s and 30s should have a clinical breast exam (CBE) by a caregiver as part of a regular health exam every 1 to 3 years. After age 40, women should have a CBE every year. Starting at age 52, women should consider having a mammogram (breast  X-ray) every year. Women who have a family history of breast cancer should talk to their caregiver about genetic screening. Women at a high risk of breast cancer should talk to their caregiver about having an MRI and a mammogram every year.  Breast cancer gene (BRCA)-related cancer risk assessment is recommended for women who have family members with BRCA-related cancers. BRCA-related cancers include breast, ovarian, tubal, and peritoneal cancers. Having family members with these cancers may be associated with an increased risk for harmful changes (mutations) in the breast cancer genes BRCA1 and BRCA2. Results of the assessment will determine the need for genetic counseling and BRCA1 and BRCA2 testing.  The Pap test is a screening test for cervical cancer. Women should have a Pap test starting at age 28. Between ages 30 and 28, Pap tests should be repeated every 2 years. Beginning at age 15, you should have a Pap test every 3 years as long as the past 3 Pap tests have been normal. If you had a hysterectomy for a problem that was not cancer or a condition  that could lead to cancer, then you no longer need Pap tests. If you are between ages 27 and 56, and you have had normal Pap tests going back 10 years, you no longer need Pap tests. If you have had past treatment for cervical cancer or a condition that could lead to cancer, you need Pap tests and screening for cancer for at least 20 years after your treatment. If Pap tests have been discontinued, risk factors (such as a new sexual partner) need to be reassessed to determine if screening should be resumed. Some women have medical problems that increase the chance of getting cervical cancer. In these cases, your caregiver may recommend more frequent screening and Pap tests.  The human papillomavirus (HPV) test is an additional test that may be used for cervical cancer screening. The HPV test looks for the virus that can cause the cell changes on the cervix. The cells collected during the Pap test can be tested for HPV. The HPV test could be used to screen women aged 30 years and older, and should be used in women of any age who have unclear Pap test results. After the age of 77, women should have HPV testing at the same frequency as a Pap test.  Colorectal cancer can be detected and often prevented. Most routine colorectal cancer screening begins at the age of 2 and continues through age 1. However, your caregiver may recommend screening at an earlier age if you have risk factors for colon cancer. On a yearly basis, your caregiver may provide home test kits to check for hidden blood in the stool. Use of a small camera at the end of a tube, to directly examine the colon (sigmoidoscopy or colonoscopy), can detect the earliest forms of colorectal cancer. Talk to your caregiver about this at age 60, when routine screening begins. Direct examination of the colon should be repeated every 5 to 10 years through age 66, unless early forms of pre-cancerous polyps or small growths are found.  Hepatitis C blood testing is  recommended for all people born from [birth year] through 1963 and any individual with known risks for hepatitis C.  Practice safe sex. Use condoms and avoid high-risk sexual practices to reduce the spread of sexually transmitted infections (STIs). Sexually active women aged 35 and younger should be checked for Chlamydia, which is a common sexually transmitted infection. Older women with new or multiple partners should  also be tested for Chlamydia. Testing for other STIs is recommended if you are sexually active and at increased risk.  Osteoporosis is a disease in which the bones lose minerals and strength with aging. This can result in serious bone fractures. The risk of osteoporosis can be identified using a bone density scan. Women ages 1 and over and women at risk for fractures or osteoporosis should discuss screening with their caregivers. Ask your caregiver whether you should be taking a calcium supplement or vitamin D to reduce the rate of osteoporosis.  Menopause can be associated with physical symptoms and risks. Hormone replacement therapy is available to decrease symptoms and risks. You should talk to your caregiver about whether hormone replacement therapy is right for you.  Use sunscreen. Apply sunscreen liberally and repeatedly throughout the day. You should seek shade when your shadow is shorter than you. Protect yourself by wearing long sleeves, pants, a wide-brimmed hat, and sunglasses year round, whenever you are outdoors.  Notify your caregiver of new moles or changes in moles, especially if there is a change in shape or color. Also notify your caregiver if a mole is larger than the size of a pencil eraser.  Stay current with your immunizations. Document Released: 10/24/2010 Document Revised: 08/05/2012 Document Reviewed: 10/24/2010 [facility] Patient Information 2014 [place].

## 2014-08-07 NOTE — Addendum Note (Signed)
Addended by: Nelva Nay on: 08/07/2014 10:33 AM   Modules accepted: Orders

## 2014-08-07 NOTE — H&P (Signed)
  Elizabeth Farley Feb 07, 1961 [id number]   History and Physical  Chief complaint: irregular bleeding, endometrial polyp  History of present illness: 54 y.o. [id number] with heavy irregular bleeding starting February 2016 after regular monthly menses receding. Evaluation included a hemoglobin of 12, FSH and TSH normal and sonohysterogram showing multiple small myomas 52, 13 and 11 mm. It also showed a 10 x 9 x 10 mm right fundal polyp with a negative endometrial biopsy. Patient is admitted for hysteroscopy D&C with resection of the endometrial polyp.  Past medical history,surgical history, medications, allergies, family history and social history were all reviewed and documented in the EPIC chart.  ROS:  Was performed and pertinent positives and negatives are included in the history of present illness.  Exam:  [name] assistant 08/07/2014 General: well developed, well nourished female, no acute distress HEENT: normal  Lungs: clear to auscultation without wheezing, rales or rhonchi  Cardiac: regular rate without rubs, murmurs or gallops  Abdomen: soft, nontender without masses, guarding, rebound, organomegaly  Pelvic: external bus vagina: normal   Cervix: grossly normal  Uterus: grossly normal size, midline and mobile, nontender  Adnexa: without gross masses or tenderness  Rectovaginal exam within normal limits    Assessment/Plan:  54 y.o. [id number] with recent heavy irregular bleeding.  Evaluation includes a normal FSH TSH and hemoglobin of 12. Ultrasound showed several small myomas measuring 52, 13 and 11 mm. Sonohysterogram showed a 10 x 9 x 10 mm right fundal polyp. Endometrial biopsy was negative. Patient is scheduled for hysteroscopy D&C with resection of her endometrial polyp.  I have reviewed what is involved with the procedure to include the intraoperative and postoperative courses as well as the recovery period. Instrumentation including hysteroscopy, resectoscope, D&C portion were reviewed. The  risks to include infection, prolonged antibiotics, hemorrhage necessitating transfusion and the risks of transfusion reviewed to include transfusion reaction, hepatitis, HIV, mad cow disease and other unknown entities. The risk of uterine perforation and damage to internal organs including bowel, bladder, ureters, vessels, nerves either immediately recognized or delay recognized necessitating major exploratory reparative surgery and future reparative surgery including bowel resection bladder repair ureteral damage repair and ostomy formation was discussed. Distended media absorption leading to metabolic complications such as fluid overload, coma, seizures was all discussed. I did review with her that she is age 54 in the perimenopausal time frame and that there are no guarantees that this will resolve her irregular bleeding and then she may continue to bleed irregularly secondary to her perimenopausal status and that we may have to deal with this is a separate issue as time moves forward.  The patient's questions were answered to her satisfaction and she'll follow up to schedule.   She has continued to bleed irregularly and I placed her on Megace 20 mg daily through the procedure to hopefully slow down/stop her bleeding.    [name] MD, 10:30 AM 08/07/2014

## 2014-08-08 LAB — URINALYSIS W MICROSCOPIC + REFLEX CULTURE
BACTERIA UA: NONE SEEN
Bilirubin Urine: NEGATIVE
CASTS: NONE SEEN
CRYSTALS: NONE SEEN
Glucose, UA: NEGATIVE mg/dL
Ketones, ur: NEGATIVE mg/dL
Nitrite: NEGATIVE
Protein, ur: NEGATIVE mg/dL
SQUAMOUS EPITHELIAL / LPF: NONE SEEN
Specific Gravity, Urine: 1.022 (ref 1.005–1.030)
UROBILINOGEN UA: 1 mg/dL (ref 0.0–1.0)
pH: 6 (ref 5.0–8.0)

## 2014-08-08 LAB — VITAMIN D 25 HYDROXY (VIT D DEFICIENCY, FRACTURES): VIT D 25 HYDROXY: 15 ng/mL — AB (ref 30–100)

## 2014-08-10 LAB — CYTOLOGY - PAP

## 2014-08-10 LAB — URINE CULTURE: Colony Count: 35000

## 2014-08-11 ENCOUNTER — Other Ambulatory Visit: Payer: Self-pay | Admitting: Gynecology

## 2014-08-11 DIAGNOSIS — E559 Vitamin D deficiency, unspecified: Secondary | ICD-10-CM

## 2014-08-12 ENCOUNTER — Telehealth: Payer: Self-pay

## 2014-08-12 NOTE — Telephone Encounter (Signed)
I called patient to let her know surgery case in front of her's cancelled and I would like to move her up to the 9:30am spot. She was agreeable. She was instructed to check in at 8:00am at [facility].

## 2014-08-16 NOTE — Anesthesia Preprocedure Evaluation (Addendum)
Anesthesia Evaluation  Patient identified by MRN, date of birth, ID band Patient awake    Reviewed: Allergy & Precautions, NPO status , Patient's Chart, lab work & pertinent test results, reviewed documented beta blocker date and time   Airway Mallampati: II   Neck ROM: Full    Dental  (+) Teeth Intact, Dental Advisory Given   Pulmonary neg pulmonary ROS,  breath sounds clear to auscultation        Cardiovascular negative cardio ROS  Rhythm:Regular     Neuro/Psych negative neurological ROS  negative psych ROS   GI/Hepatic negative GI ROS, Neg liver ROS,   Endo/Other  Hypothyroidism Morbid obesity  Renal/GU      Musculoskeletal   Abdominal (+) + obese,   Peds  Hematology 12/36   Anesthesia Other Findings   Reproductive/Obstetrics                            Anesthesia Physical Anesthesia Plan  ASA: II  Anesthesia Plan: General   Post-op Pain Management:    Induction: Intravenous and Rapid sequence  Airway Management Planned: Oral ETT  Additional Equipment:   Intra-op Plan:   Post-operative Plan: Extubation in OR  Informed Consent: I have reviewed the patients History and Physical, chart, labs and discussed the procedure including the risks, benefits and alternatives for the proposed anesthesia with the patient or authorized representative who has indicated his/her understanding and acceptance.     Plan Discussed with:   Anesthesia Plan Comments: (Glide available)        Anesthesia Quick Evaluation

## 2014-08-17 MED ORDER — DEXTROSE 5 % IV SOLN
2.0000 g | INTRAVENOUS | Status: DC
  Filled 2014-08-17: qty 2

## 2014-08-18 ENCOUNTER — Encounter (HOSPITAL_COMMUNITY)
Admission: RE | Disposition: A | Discharge status: Home / Self Care | Payer: Self-pay | Source: Ambulatory Visit | Attending: Gynecology

## 2014-08-18 ENCOUNTER — Ambulatory Visit (HOSPITAL_COMMUNITY): Payer: 59 | Admitting: Anesthesiology

## 2014-08-18 ENCOUNTER — Ambulatory Visit (HOSPITAL_COMMUNITY)
Admission: RE | Admit: 2014-08-18 | Discharge: 2014-08-18 | Disposition: A | Discharge status: Home / Self Care | Payer: 59 | Source: Ambulatory Visit | Attending: Gynecology | Admitting: Gynecology

## 2014-08-18 DIAGNOSIS — E039 Hypothyroidism, unspecified: Secondary | ICD-10-CM | POA: Diagnosis not present

## 2014-08-18 DIAGNOSIS — N926 Irregular menstruation, unspecified: Secondary | ICD-10-CM | POA: Diagnosis not present

## 2014-08-18 DIAGNOSIS — N840 Polyp of corpus uteri: Secondary | ICD-10-CM | POA: Insufficient documentation

## 2014-08-18 HISTORY — PX: DILATATION & CURETTAGE/HYSTEROSCOPY WITH MYOSURE: SHX6511

## 2014-08-18 LAB — PREGNANCY, URINE: PREG TEST UR: NEGATIVE

## 2014-08-18 SURGERY — DILATATION & CURETTAGE/HYSTEROSCOPY WITH MYOSURE
Anesthesia: General | Site: Uterus

## 2014-08-18 MED ORDER — MIDAZOLAM HCL 2 MG/2ML IJ SOLN
INTRAMUSCULAR | Status: DC | PRN
  Administered 2014-08-18: 1 mg via INTRAVENOUS

## 2014-08-18 MED ORDER — DEXAMETHASONE SODIUM PHOSPHATE 10 MG/ML IJ SOLN
INTRAMUSCULAR | Status: DC | PRN
  Administered 2014-08-18: 10 mg via INTRAVENOUS

## 2014-08-18 MED ORDER — KETOROLAC TROMETHAMINE 30 MG/ML IJ SOLN
INTRAMUSCULAR | Status: AC
  Filled 2014-08-18: qty 1

## 2014-08-18 MED ORDER — SUCCINYLCHOLINE CHLORIDE 20 MG/ML IJ SOLN
INTRAMUSCULAR | Status: DC | PRN
  Administered 2014-08-18: 160 mg via INTRAVENOUS

## 2014-08-18 MED ORDER — ONDANSETRON HCL 4 MG/2ML IJ SOLN
INTRAMUSCULAR | Status: AC
  Filled 2014-08-18: qty 2

## 2014-08-18 MED ORDER — NEOSTIGMINE METHYLSULFATE 10 MG/10ML IV SOLN
INTRAVENOUS | Status: AC
  Filled 2014-08-18: qty 1

## 2014-08-18 MED ORDER — DEXAMETHASONE SODIUM PHOSPHATE 10 MG/ML IJ SOLN
INTRAMUSCULAR | Status: AC
  Filled 2014-08-18: qty 1

## 2014-08-18 MED ORDER — NEOSTIGMINE METHYLSULFATE 10 MG/10ML IV SOLN
INTRAVENOUS | Status: DC | PRN
  Administered 2014-08-18: 3 mg via INTRAVENOUS

## 2014-08-18 MED ORDER — SCOPOLAMINE 1 MG/3DAYS TD PT72: 1.0000 | MEDICATED_PATCH | Freq: Once | TRANSDERMAL | Status: DC

## 2014-08-18 MED ORDER — MIDAZOLAM HCL 2 MG/2ML IJ SOLN
INTRAMUSCULAR | Status: AC
  Filled 2014-08-18: qty 2

## 2014-08-18 MED ORDER — LIDOCAINE HCL 1 % IJ SOLN
INTRAMUSCULAR | Status: DC | PRN
  Administered 2014-08-18: 10 mL

## 2014-08-18 MED ORDER — PROPOFOL 10 MG/ML IV BOLUS
INTRAVENOUS | Status: AC
  Filled 2014-08-18: qty 20

## 2014-08-18 MED ORDER — ONDANSETRON HCL 4 MG/2ML IJ SOLN
INTRAMUSCULAR | Status: DC | PRN
  Administered 2014-08-18: 4 mg via INTRAVENOUS

## 2014-08-18 MED ORDER — SODIUM CHLORIDE 0.9 % IR SOLN
Status: DC | PRN
  Administered 2014-08-18: 3000 mL

## 2014-08-18 MED ORDER — KETOROLAC TROMETHAMINE 30 MG/ML IJ SOLN
INTRAMUSCULAR | Status: DC | PRN
  Administered 2014-08-18: 30 mg via INTRAVENOUS

## 2014-08-18 MED ORDER — LIDOCAINE HCL 1 % IJ SOLN
INTRAMUSCULAR | Status: AC
  Filled 2014-08-18: qty 20

## 2014-08-18 MED ORDER — PROPOFOL 10 MG/ML IV BOLUS
INTRAVENOUS | Status: DC | PRN
  Administered 2014-08-18: 200 mg via INTRAVENOUS

## 2014-08-18 MED ORDER — GLYCOPYRROLATE 0.2 MG/ML IJ SOLN
INTRAMUSCULAR | Status: AC
  Filled 2014-08-18: qty 3

## 2014-08-18 MED ORDER — LACTATED RINGERS IV SOLN
INTRAVENOUS | Status: DC
  Administered 2014-08-18: 08:00:00 via INTRAVENOUS

## 2014-08-18 MED ORDER — FENTANYL CITRATE (PF) 100 MCG/2ML IJ SOLN
INTRAMUSCULAR | Status: DC | PRN
  Administered 2014-08-18: 100 ug via INTRAVENOUS

## 2014-08-18 MED ORDER — ROCURONIUM BROMIDE 100 MG/10ML IV SOLN
INTRAVENOUS | Status: DC | PRN
  Administered 2014-08-18: 20 mg via INTRAVENOUS

## 2014-08-18 MED ORDER — LIDOCAINE HCL (CARDIAC) 20 MG/ML IV SOLN
INTRAVENOUS | Status: DC | PRN
  Administered 2014-08-18: 100 mg via INTRAVENOUS

## 2014-08-18 MED ORDER — SUCCINYLCHOLINE CHLORIDE 20 MG/ML IJ SOLN
INTRAMUSCULAR | Status: AC
  Filled 2014-08-18: qty 10

## 2014-08-18 MED ORDER — FENTANYL CITRATE (PF) 100 MCG/2ML IJ SOLN: 25.0000 ug | INTRAMUSCULAR | Status: DC | PRN

## 2014-08-18 MED ORDER — PROMETHAZINE HCL 25 MG/ML IJ SOLN: 6.2500 mg | INTRAMUSCULAR | Status: DC | PRN

## 2014-08-18 MED ORDER — MEPERIDINE HCL 25 MG/ML IJ SOLN: 6.2500 mg | INTRAMUSCULAR | Status: DC | PRN

## 2014-08-18 MED ORDER — ROCURONIUM BROMIDE 100 MG/10ML IV SOLN
INTRAVENOUS | Status: AC
  Filled 2014-08-18: qty 1

## 2014-08-18 MED ORDER — GLYCOPYRROLATE 0.2 MG/ML IJ SOLN
INTRAMUSCULAR | Status: DC | PRN
  Administered 2014-08-18: 0.4 mg via INTRAVENOUS
  Administered 2014-08-18: 0.2 mg via INTRAVENOUS

## 2014-08-18 MED ORDER — FENTANYL CITRATE (PF) 250 MCG/5ML IJ SOLN
INTRAMUSCULAR | Status: AC
  Filled 2014-08-18: qty 5

## 2014-08-18 MED ORDER — OXYCODONE-ACETAMINOPHEN 5-325 MG PO TABS: 1.0000 | ORAL_TABLET | ORAL | Status: DC | PRN

## 2014-08-18 MED ORDER — LIDOCAINE HCL (CARDIAC) 20 MG/ML IV SOLN
INTRAVENOUS | Status: AC
  Filled 2014-08-18: qty 5

## 2014-08-18 SURGICAL SUPPLY — 15 items
CATH ROBINSON RED A/P 16FR (CATHETERS) ×2 IMPLANT
CLOTH BEACON ORANGE TIMEOUT ST (SAFETY) ×2 IMPLANT
CONTAINER PREFILL 10% NBF 60ML (FORM) ×4 IMPLANT
DEVICE MYOSURE CLASSIC (MISCELLANEOUS) IMPLANT
DEVICE MYOSURE LITE (MISCELLANEOUS) ×1 IMPLANT
FILTER ARTHROSCOPY CONVERTOR (FILTER) ×2 IMPLANT
GLOVE BIO SURGEON STRL SZ7.5 (GLOVE) ×4 IMPLANT
GOWN STRL REUS W/TWL LRG LVL3 (GOWN DISPOSABLE) ×4 IMPLANT
PACK VAGINAL MINOR WOMEN LF (CUSTOM PROCEDURE TRAY) ×2 IMPLANT
PAD OB MATERNITY 4.3X12.25 (PERSONAL CARE ITEMS) ×2 IMPLANT
SEAL ROD LENS SCOPE MYOSURE (ABLATOR) ×2 IMPLANT
TOWEL OR 17X24 6PK STRL BLUE (TOWEL DISPOSABLE) ×4 IMPLANT
TUBING AQUILEX INFLOW (TUBING) ×2 IMPLANT
TUBING AQUILEX OUTFLOW (TUBING) ×2 IMPLANT
WATER STERILE IRR 1000ML POUR (IV SOLUTION) ×2 IMPLANT

## 2014-08-18 NOTE — Anesthesia Postprocedure Evaluation (Signed)
  Anesthesia Post-op Note  Patient: Elizabeth Farley  Procedure(s) Performed: Procedure(s): DILATATION & CURETTAGE/HYSTEROSCOPY WITH MYOSURE (N/A)  Patient Lo cation: PACU  Anesthesia Type:General  Level of Consciousness: awake and alert   Airway and Oxygen Therapy: Patient Spontanous Breathing and Patient connected to nasal cannula oxygen  Post-op Pain: mild  Post-op Assessment: Post-op Vital signs reviewed, Patient's Cardiovascular Status Stable, Respiratory Function Stable and Patent Airway  Post-op Vital Signs: Reviewed and stable  Last Vitals:  Filed Vitals:   08/18/14 0900  BP: 133/68  Pulse: 86  Temp: 36.5 C  Resp: 24    Complications: No apparent anesthesia complications

## 2014-08-18 NOTE — Discharge Instructions (Signed)
° °  Postoperative Instructions Hysteroscopy D & C  Dr. Phineas Real and the nursing staff have discussed postoperative instructions with you.  If you have any questions please ask them before you leave the hospital, or call Dr Elisabeth Most office at [phone].    We would like to emphasize the following instructions:  MAY TAKE IBUPROFEN (MOTRIN, ADVIL) OR ALEVE AFTER 2:35 PM FOR CRAMPS!!   ? Call the office to make your follow-up appointment as recommended by Dr Phineas Real (usually 1-2 weeks).  ? You were given a prescription, or one was ordered for you at the pharmacy you designated.  Get that prescription filled and take the medication according to instructions.  ? You may eat a regular diet, but slowly until you start having bowel movements.  ? Drink plenty of water daily.  ? Nothing in the vagina (intercourse, douching, objects of any kind) for two weeks.  When reinitiating intercourse, if it is uncomfortable, stop and make an appointment with Dr Phineas Real to be evaluated.  ? No driving for one to two days until the effects of anesthesia has worn off.  No traveling out of town for several days.  ? You may shower, but no baths for one week.  Walking up and down stairs is ok.  No heavy lifting, prolonged standing, repeated bending or any working out until your post op check.  ? Rest frequently, listen to your body and do not push yourself and overdo it.  ? Call if:  o Your pain medication does not seem strong enough. o Worsening pain or abdominal bloating o Persistent nausea or vomiting o Difficulty with urination or bowel movements. o Temperature of 101 degrees or higher. o Heavy vaginal bleeding.  If your period is due, you may use tampons. o You have any questions or concerns

## 2014-08-18 NOTE — Op Note (Signed)
Elizabeth Farley 11-09-1960 [id number]   Post Operative Note   Date of surgery:  08/18/2014  Pre Op Dx:  Irregular vaginal bleeding, endometrial polyp  Post Op Dx:  Irregular vaginal bleeding, endometrial polyp  Procedure:  Hysteroscopy, D&C, Myosure resection endometrial polyp  Surgeon:  Donalynn Furlong P  Anesthesia:  General  EBL:  minimal  Distended media discrepancy:  35 cc saline  Complications:  None  Specimen:  #1 endometrial polyp #2 endometrial curetting to pathology  Findings: EUA:  External BUS vagina normal. Cervix normal. Uterus difficult to palpate but grossly normal midline mobile. Adnexa without gross masses or tenderness   Hysteroscopy:  Adequate noting fundus, anterior/posterior endometrial surfaces, lower uterine segment, endocervical canal, right and left tubal ostia all visualized. Large endometrial polyp filling the cavity emanating from the fundus. Endometrium otherwise with polypoid/hyperplastic pattern.  Procedure:   The patient was taken to the operating room,underwent general anesthesia, placed in the low dorsal lithotomy position, received a perineal/vaginal preparation with Betadine solution per nursing personnel and the bladder was emptied with an in and out Foley catheterization. The time out was performed by the surgical team. The patient was draped in the usual fashion. An EUA was performed and the cervix was visualized with a speculum, anterior lip grasped with a single-tooth tenaculum and a paracervical block using 1% lidocaine was placed a total of 10 cc. The cervix was gently dilated to admit the Myosure hysteroscope and hysteroscopy was performed with findings noted above. Using the standard Myosure wand the polyp and polypoid endometrial areas were resected in their entirety to the level of the surrounding endometrium. A gentle sharp curettage was performed and the specimens were sent separately to pathology. Repeat hysteroscopy showed an empty cavity  with good distention and no evidence of perforation. The tenaculum was removed and hemostasis was visualized at the tenaculum site and the external os. The speculum was removed, the patient received intraoperative Toradol, the patient was awakened without difficulty and taken to recovery in good condition having tolerated the procedure well.     [name] MD, 9:16 AM 08/18/2014

## 2014-08-18 NOTE — Anesthesia Procedure Notes (Signed)
Procedure Name: Intubation Date/Time: 08/18/2014 8:18 AM Performed by: Mayer Camel, Kellye Mizner A Pre-anesthesia Checklist: Patient identified, Emergency Drugs available, Suction available and Patient being monitored Patient Re-evaluated:Patient Re-evaluated prior to inductionOxygen Delivery Method: Circle system utilized Preoxygenation: Pre-oxygenation with 100% oxygen Ventilation: Mask ventilation without difficulty Laryngoscope Size: Miller and 2 Grade View: Grade II Tube type: Oral Tube size: 7.0 mm Number of attempts: 1 Placement Confirmation: ETT inserted through vocal cords under direct vision,  positive ETCO2 and breath sounds checked- equal and bilateral Secured at: 22 cm Tube secured with: Tape Dental Injury: Teeth and Oropharynx as per pre-operative assessment

## 2014-08-18 NOTE — H&P (Signed)
  The patient was examined.  I reviewed the proposed surgery and consent form with the patient.  The dictated history and physical is current and accurate and all questions were answered. The patient is ready to proceed with surgery and has a realistic understanding and expectation for the outcome.   [name] MD, 7:55 AM 08/18/2014

## 2014-08-18 NOTE — Transfer of Care (Signed)
Immediate Anesthesia Transfer of Care Note  Patient: Elizabeth Farley  Procedure(s) Performed: Procedure(s): DILATATION & CURETTAGE/HYSTEROSCOPY WITH MYOSURE (N/A)  Patient Location: PACU  Anesthesia Type:General  Level of Consciousness: awake, alert  and oriented  Airway & Oxygen Therapy: Patient Spontanous Breathing and Patient connected to nasal cannula oxygen  Post-op Assessment: Report given to RN, Post -op Vital signs reviewed and stable and Patient moving all extremities X 4  Post vital signs: Reviewed and stable  Last Vitals:  Filed Vitals:   08/18/14 0733  BP: 144/80  Pulse: 79  Temp: 36.6 C  Resp: 20    Complications: No apparent anesthesia complications

## 2014-08-19 ENCOUNTER — Encounter (HOSPITAL_COMMUNITY): Payer: Self-pay | Admitting: Gynecology

## 2014-08-31 ENCOUNTER — Encounter: Payer: Self-pay | Admitting: Gynecology

## 2014-08-31 ENCOUNTER — Ambulatory Visit (INDEPENDENT_AMBULATORY_CARE_PROVIDER_SITE_OTHER): Payer: 59 | Admitting: Gynecology

## 2014-08-31 VITALS — BP 124/76

## 2014-08-31 DIAGNOSIS — Z09 Encounter for follow-up examination after completed treatment for conditions other than malignant neoplasm: Secondary | ICD-10-CM

## 2014-08-31 DIAGNOSIS — N926 Irregular menstruation, unspecified: Secondary | ICD-10-CM

## 2014-08-31 NOTE — Progress Notes (Signed)
Elizabeth Farley [birth year] [id number]        54 y.o.  [id number] Presents for her postoperative visit status post hysteroscopy D&C resection of endometrial polyp. Has done well but has continued to bleed on and off over the last several weeks.  Past medical history,surgical history, problem list, medications, allergies, family history and social history were all reviewed and documented in the EPIC chart.  Directed ROS with pertinent positives and negatives documented in the history of present illness/assessment and plan.  Exam: [name] assistant Filed Vitals:   08/31/14 0913  BP: 124/76   General appearance:  Normal  abdomen soft nontender without masses guarding rebound Pelvic external BUS vagina withmenstrual type flow. Cervix normal. Uterus grossly normal midline mobile. Adnexa without masses or tenderness.  Assessment/Plan:  54 y.o. [id number] with recent resection of benign endometrial polyp. Has continued to bleed on and off since. Has 20 mg Megace at home. Will take daily 2 weeks. She'll call if her irregular bleeding continues. Assuming it resolves then she'll follow up routinely.    [name] MD, 9:35 AM 08/31/2014

## 2014-08-31 NOTE — Patient Instructions (Signed)
Take the Megace medication daily for 2 weeks. Call me if irregular bleeding continues.

## 2014-10-09 ENCOUNTER — Emergency Department (HOSPITAL_COMMUNITY)
Admission: EM | Admit: 2014-10-09 | Discharge: 2014-10-09 | Disposition: A | Discharge status: Home / Self Care | Payer: 59 | Attending: Emergency Medicine | Admitting: Emergency Medicine

## 2014-10-09 ENCOUNTER — Emergency Department (HOSPITAL_COMMUNITY): Payer: 59

## 2014-10-09 ENCOUNTER — Encounter (HOSPITAL_COMMUNITY): Payer: Self-pay | Admitting: Family Medicine

## 2014-10-09 DIAGNOSIS — R079 Chest pain, unspecified: Secondary | ICD-10-CM | POA: Diagnosis present

## 2014-10-09 DIAGNOSIS — M7918 Myalgia, other site: Secondary | ICD-10-CM

## 2014-10-09 DIAGNOSIS — R0789 Other chest pain: Secondary | ICD-10-CM | POA: Diagnosis not present

## 2014-10-09 DIAGNOSIS — E039 Hypothyroidism, unspecified: Secondary | ICD-10-CM | POA: Insufficient documentation

## 2014-10-09 DIAGNOSIS — R202 Paresthesia of skin: Secondary | ICD-10-CM | POA: Diagnosis not present

## 2014-10-09 DIAGNOSIS — M546 Pain in thoracic spine: Secondary | ICD-10-CM | POA: Insufficient documentation

## 2014-10-09 LAB — BASIC METABOLIC PANEL
Anion gap: 8 (ref 5–15)
BUN: 8 mg/dL (ref 6–20)
CO2: 26 mmol/L (ref 22–32)
CREATININE: 0.77 mg/dL (ref 0.44–1.00)
Calcium: 9.1 mg/dL (ref 8.9–10.3)
Chloride: 103 mmol/L (ref 101–111)
GFR calc non Af Amer: >60 mL/min (ref >60)
GLUCOSE: 142 mg/dL — AB (ref 65–99)
Potassium: 3.6 mmol/L (ref 3.5–5.1)
Sodium: 137 mmol/L (ref 135–145)

## 2014-10-09 LAB — CBC
HCT: 39.1 % (ref 36.0–46.0)
Hemoglobin: 12.4 g/dL (ref 12.0–15.0)
MCH: 25.1 pg — ABNORMAL LOW (ref 26.0–34.0)
MCHC: 31.7 g/dL (ref 30.0–36.0)
MCV: 79.1 fL (ref 78.0–100.0)
PLATELETS: 334 10*3/uL (ref 150–400)
RBC: 4.94 MIL/uL (ref 3.87–5.11)
RDW: 16.1 % — ABNORMAL HIGH (ref 11.5–15.5)
WBC: 10.6 10*3/uL — AB (ref 4.0–10.5)

## 2014-10-09 LAB — I-STAT TROPONIN, ED: TROPONIN I, POC: 0 ng/mL (ref 0.00–0.08)

## 2014-10-09 LAB — D-DIMER, QUANTITATIVE: D-Dimer, Quant: <0.27 ug/mL-FEU (ref 0.00–0.48)

## 2014-10-09 MED ORDER — LORAZEPAM 2 MG/ML IJ SOLN
1.0000 mg | Freq: Once | INTRAMUSCULAR | Status: DC
  Filled 2014-10-09: qty 1

## 2014-10-09 MED ORDER — DIAZEPAM 2 MG PO TABS: 2.0000 mg | ORAL_TABLET | Freq: Four times a day (QID) | ORAL | Status: DC | PRN

## 2014-10-09 MED ORDER — KETOROLAC TROMETHAMINE 30 MG/ML IJ SOLN
30.0000 mg | Freq: Once | INTRAMUSCULAR | Status: DC
  Filled 2014-10-09: qty 1

## 2014-10-09 MED ORDER — DIAZEPAM 5 MG PO TABS
5.0000 mg | ORAL_TABLET | Freq: Once | ORAL | Status: AC
  Administered 2014-10-09: 5 mg via ORAL
  Filled 2014-10-09: qty 1

## 2014-10-09 MED ORDER — KETOROLAC TROMETHAMINE 60 MG/2ML IM SOLN
60.0000 mg | Freq: Once | INTRAMUSCULAR | Status: AC
  Administered 2014-10-09: 60 mg via INTRAMUSCULAR
  Filled 2014-10-09: qty 2

## 2014-10-09 MED ORDER — NAPROXEN 500 MG PO TABS: 500.0000 mg | ORAL_TABLET | Freq: Two times a day (BID) | ORAL | Status: DC

## 2014-10-09 NOTE — ED Notes (Signed)
Pt here for left neck pain, numbness in left arm and pain that started yesterday. Denies injury.

## 2014-10-09 NOTE — ED Notes (Signed)
Pt verbalizes understanding of d/c instructions and denies any further needs at this time. 

## 2014-10-09 NOTE — ED Provider Notes (Signed)
CSN: [id number]     Arrival date & time 10/09/14  1450 History   First MD Initiated Contact with Patient 10/09/14 1629     Chief Complaint  Patient presents with  . Chest Pain     (Consider location/radiation/quality/duration/timing/severity/associated sxs/prior Treatment) HPI Comments: Patient here complaining of left posterior thoracic lateral back pain which began yesterday. Symptoms have been pinpoint in nature and characterized as sharp and worse with certain movements. Last for possibly 5 minutes. No associated anginal type symptoms. No dyspnea or diaphoresis. Nonpleuritic. No leg pain or swelling. No syncope or near CP. Patient has had some associated left arm paresthesias but denies any associated neck pain. No headache or slurred speech. Symptoms resolved spontaneously and no treatment used for this prior to arrival. No prior history of same. Denies any fever, cough, hemoptysis. No rashes noted either.  Patient is a 54 y.o. female presenting with chest pain. The history is provided by the patient.  Chest Pain   Past Medical History  Diagnosis Date  . Thyroid disease     hypothyroid   Past Surgical History  Procedure Laterality Date  . Nose surgery    . Tubal ligation    . Pelvic laparoscopy      x3 due to ectopic pregnancis   . Dilatation & curettage/hysteroscopy with myosure N/A 08/18/2014    Procedure: DILATATION & CURETTAGE/HYSTEROSCOPY WITH MYOSURE;  Surgeon: [name], MD;  Location: [facility];  Service: Gynecology;  Laterality: N/A;   Family History  Problem Relation Age of Onset  . Atrial fibrillation Mother   . Heart disease Father   . Diabetes Father    History  Substance Use Topics  . Smoking status: Never Smoker   . Smokeless tobacco: Not on file  . Alcohol Use: No   OB History    Gravida Para Term Preterm AB TAB SAB Ectopic Multiple Living   5 0 0 0 5 0 3 2  0     Review of Systems  Cardiovascular: Positive for chest pain.  All other systems  reviewed and are negative.     Allergies  Review of patient's allergies indicates no known allergies.  Home Medications   Prior to Admission medications   Medication Sig Start Date End Date Taking? Authorizing Provider  famotidine (PEPCID) 10 MG tablet Take 10 mg by mouth daily as needed for heartburn or indigestion.    Historical Provider, MD  ibuprofen (ADVIL,MOTRIN) 200 MG tablet Take 400 mg by mouth every 6 (six) hours as needed for mild pain.    Historical Provider, MD  levothyroxine (SYNTHROID, LEVOTHROID) 75 MCG tablet Take 1 tablet (75 mcg total) by mouth daily. Patient not taking: Reported on 08/04/2014 04/03/12   [name], MD  oxyCODONE-acetaminophen (ROXICET) 5-325 MG per tablet Take 1 tablet by mouth every 4 (four) hours as needed for severe pain. Patient not taking: Reported on 08/31/2014 08/18/14   [name], MD   BP 132/71 mmHg  Pulse 81  Temp(Src) 98.3 F (36.8 C) (Oral)  Resp 20  SpO2 97%  LMP 09/01/2014 Physical Exam  Constitutional: She is oriented to person, place, and time. She appears well-developed and well-nourished.  Non-toxic appearance. No distress.  HENT:  Head: Normocephalic and atraumatic.  Eyes: Conjunctivae, EOM and lids are normal. Pupils are equal, round, and reactive to light.  Neck: Normal range of motion. Neck supple. No tracheal deviation present. No thyroid mass present.  Cardiovascular: Normal rate, regular rhythm and normal heart sounds.  Exam reveals no gallop.   No murmur heard. Pulmonary/Chest: Effort normal and breath sounds normal. No stridor. No respiratory distress. She has no decreased breath sounds. She has no wheezes. She has no rhonchi. She has no rales.  Abdominal: Soft. Normal appearance and bowel sounds are normal. She exhibits no distension. There is no tenderness. There is no rebound and no CVA tenderness.  Musculoskeletal: Normal range of motion. She exhibits no edema or tenderness.        Back:  Neurological: She is alert and oriented to person, place, and time. She has normal strength. No cranial nerve deficit or sensory deficit. GCS eye subscore is 4. GCS verbal subscore is 5. GCS motor subscore is 6.  Skin: Skin is warm and dry. No abrasion and no rash noted.  Psychiatric: She has a normal mood and affect. Her speech is normal and behavior is normal.  Nursing note and vitals reviewed.   ED Course  Procedures (including critical care time) Labs Review Labs Reviewed  CBC - Abnormal; Notable for the following:    WBC 10.6 (*)    MCH 25.1 (*)    RDW 16.1 (*)    All other components within normal limits  BASIC METABOLIC PANEL - Abnormal; Notable for the following:    Glucose, Bld 142 (*)    All other components within normal limits  D-DIMER, QUANTITATIVE (NOT AT [facility])  Randolm Idol, ED    Imaging Review Dg Chest 2 View  10/09/2014   CLINICAL DATA:  Chest pain today  EXAM: CHEST - 2 VIEW  COMPARISON:  None.  FINDINGS: The heart size and mediastinal contours are within normal limits. Both lungs are clear. The visualized skeletal structures are unremarkable.  IMPRESSION: No active disease.   Electronically Signed   By: Inez Catalina M.D.   On: 10/09/2014 15:57     EKG Interpretation   Date/Time:  Friday October 09 2014 15:14:54 EDT Ventricular Rate:  85 PR Interval:  142 QRS Duration: 70 QT Interval:  342 QTC Calculation: 406 R Axis:   31 Text Interpretation:  Normal sinus rhythm Nonspecific ST and T wave  abnormality Abnormal ECG Confirmed by Amity Roes  MD, Sou Nohr ([id number]) on  10/09/2014 4:49:48 PM      MDM   Final diagnoses:  Chest pain   patient given Toradol and Valium and feels better. D-dimer negative. Troponin negative. No concern for ACS. Suspect musculoskeletal etiology. Patient stable for discharge    Lacretia Leigh, MD 10/09/14 2006

## 2014-10-09 NOTE — Discharge Instructions (Signed)

## 2015-03-27 ENCOUNTER — Emergency Department
Admission: EM | Admit: 2015-03-27 | Discharge: 2015-03-27 | Disposition: A | Discharge status: Home / Self Care | Payer: 59 | Source: Home / Self Care | Attending: Family Medicine | Admitting: Family Medicine

## 2015-03-27 ENCOUNTER — Encounter: Payer: Self-pay | Admitting: Emergency Medicine

## 2015-03-27 DIAGNOSIS — N390 Urinary tract infection, site not specified: Secondary | ICD-10-CM | POA: Diagnosis not present

## 2015-03-27 LAB — POCT URINALYSIS DIP (MANUAL ENTRY)
Glucose, UA: NEGATIVE
Nitrite, UA: POSITIVE — AB
Protein Ur, POC: >=300 — AB
Spec Grav, UA: 1.02 (ref 1.005–1.03)
Urobilinogen, UA: 1 (ref 0–1)
pH, UA: 5.5 (ref 5–8)

## 2015-03-27 MED ORDER — CEPHALEXIN 500 MG PO CAPS: 500.0000 mg | ORAL_CAPSULE | Freq: Two times a day (BID) | ORAL | Status: DC

## 2015-03-27 NOTE — ED Provider Notes (Signed)
CSN: [id number]     Arrival date & time 03/27/15  1603 History   First MD Initiated Contact with Patient 03/27/15 1607     Chief Complaint  Patient presents with  . Dysuria   (Consider location/radiation/quality/duration/timing/severity/associated sxs/prior Treatment) HPI  Pt is a 54yo female presenting to [facility] with c/o gradually worsening urinary symptoms including burning with urination, urinary frequency, urgency and decreased urine for 4 days. She has not tried any OTC medications for symptoms yet.  Denies fever, chills, n/v/d. Denies lower abdominal pain or back pain. Last UTI was several years ago. Denies vaginal symptoms.  Past Medical History  Diagnosis Date  . Thyroid disease     hypothyroid   Past Surgical History  Procedure Laterality Date  . Nose surgery    . Tubal ligation    . Pelvic laparoscopy      x3 due to ectopic pregnancis   . Dilatation & curettage/hysteroscopy with myosure N/A 08/18/2014    Procedure: DILATATION & CURETTAGE/HYSTEROSCOPY WITH MYOSURE;  Surgeon: [name], MD;  Location: [facility];  Service: Gynecology;  Laterality: N/A;   Family History  Problem Relation Age of Onset  . Atrial fibrillation Mother   . Heart disease Father   . Diabetes Father    Social History  Substance Use Topics  . Smoking status: Never Smoker   . Smokeless tobacco: None  . Alcohol Use: No   OB History    Gravida Para Term Preterm AB TAB SAB Ectopic Multiple Living   5 0 0 0 5 0 3 2  0     Review of Systems  Constitutional: Negative for fever and chills.  Gastrointestinal: Negative for nausea, vomiting, abdominal pain and diarrhea.  Genitourinary: Positive for dysuria, urgency, frequency, hematuria and decreased urine volume. Negative for flank pain, vaginal bleeding, vaginal discharge, vaginal pain and pelvic pain.  Musculoskeletal: Negative for myalgias and arthralgias.    Allergies  Review of patient's allergies indicates no known allergies.  Home  Medications   Prior to Admission medications   Medication Sig Start Date End Date Taking? Authorizing Provider  cephALEXin (KEFLEX) 500 MG capsule Take 1 capsule (500 mg total) by mouth 2 (two) times daily. For 7 days 03/27/15   Noland Fordyce, PA-C  diazepam (VALIUM) 2 MG tablet Take 1 tablet (2 mg total) by mouth every 6 (six) hours as needed for muscle spasms. 10/09/14   Lacretia Leigh, MD  famotidine (PEPCID) 10 MG tablet Take 10 mg by mouth daily as needed for heartburn or indigestion.    Historical Provider, MD  ibuprofen (ADVIL,MOTRIN) 200 MG tablet Take 400 mg by mouth every 6 (six) hours as needed for mild pain.    Historical Provider, MD  levothyroxine (SYNTHROID, LEVOTHROID) 75 MCG tablet Take 1 tablet (75 mcg total) by mouth daily. Patient not taking: Reported on 08/04/2014 04/03/12   [name], MD  naproxen (NAPROSYN) 500 MG tablet Take 1 tablet (500 mg total) by mouth 2 (two) times daily. 10/09/14   Lacretia Leigh, MD   Meds Ordered and Administered this Visit  Medications - No data to display  BP 118/82 mmHg  Pulse 82  Temp(Src) 97.9 F (36.6 C) (Oral)  Resp 16  Ht 5\' 6"  (1.676 m)  Wt 234 lb (106.142 kg)  BMI 37.79 kg/m2  SpO2 98%  LMP 03/05/2015 (Exact Date) No data found.   Physical Exam  Constitutional: She appears well-developed and well-nourished. No distress.  HENT:  Head: Normocephalic and atraumatic.  Eyes:  Conjunctivae are normal. No scleral icterus.  Neck: Normal range of motion. Neck supple.  Cardiovascular: Normal rate, regular rhythm and normal heart sounds.   Pulmonary/Chest: Effort normal and breath sounds normal. No respiratory distress. She has no wheezes. She has no rales. She exhibits no tenderness.  Abdominal: Soft. She exhibits no distension and no mass. There is no tenderness. There is no rebound, no guarding and no CVA tenderness.  Musculoskeletal: Normal range of motion.  Neurological: She is alert.  Skin: Skin is warm and dry. She is  not diaphoretic.  Nursing note and vitals reviewed.   ED Course  Procedures (including critical care time)  Labs Review Labs Reviewed  POCT URINALYSIS DIP (MANUAL ENTRY) - Abnormal; Notable for the following:    Color, UA other (*)    Clarity, UA cloudy (*)    Bilirubin, UA small (*)    Ketones, POC UA trace (5) (*)    Blood, UA large (*)    Protein Ur, POC >=300 (*)    Nitrite, UA Positive (*)    Leukocytes, UA large (3+) (*)    All other components within normal limits  URINE CULTURE    Imaging Review No results found.    MDM   1. UTI (lower urinary tract infection)    Pt is a 54yo female c/o worsening urinary symptoms for 4 days.   UA c/w UTI.  Rx: keflex Advised pt she may take OTC phenazopyridine  F/u with PCP in 1 week if not improving, sooner if worsening. Patient verbalized understanding and agreement with treatment plan.    Noland Fordyce, PA-C 03/27/15 1702

## 2015-03-27 NOTE — ED Notes (Signed)
Reports worsening dysuria x 4 days; may also be starting menses today. No OTCs.

## 2015-03-27 NOTE — Discharge Instructions (Signed)
You may try an over the counter medication called phenazopyridine (e.g. Azo, Baridium or pyridium) for urinary discomfort and bladder spasms.  Please ask your pharmacist where to find this medication at your local pharmacy.  The medication does turn your urine orange, this is normal.  Do not take more than is indicated on the packaging.   Please take antibiotics as prescribed and be sure to complete entire course even if you start to feel better to ensure infection does not come back.

## 2015-03-29 ENCOUNTER — Telehealth: Payer: Self-pay | Admitting: *Deleted

## 2015-03-29 LAB — URINE CULTURE: Colony Count: 55000

## 2015-07-06 ENCOUNTER — Encounter: Payer: Self-pay | Admitting: Gynecology

## 2015-07-06 ENCOUNTER — Ambulatory Visit (INDEPENDENT_AMBULATORY_CARE_PROVIDER_SITE_OTHER): Payer: 59 | Admitting: Gynecology

## 2015-07-06 ENCOUNTER — Telehealth: Payer: Self-pay | Admitting: *Deleted

## 2015-07-06 VITALS — BP 124/84

## 2015-07-06 DIAGNOSIS — N644 Mastodynia: Secondary | ICD-10-CM | POA: Diagnosis not present

## 2015-07-06 NOTE — Telephone Encounter (Signed)
Appointment on 07/15/15 @ 1pm at [facility], pt aware, order faxed.

## 2015-07-06 NOTE — Patient Instructions (Signed)
The mammogram office will call you to arrange the mammogram.  If you do not hear from them within 1 week call our office.

## 2015-07-06 NOTE — Telephone Encounter (Signed)
-----   Message from [name], MD sent at 07/06/2015  8:21 AM EDT ----- Schedule diagnostic mammogram reference new onset right breast pain, normal exam

## 2015-07-06 NOTE — Progress Notes (Signed)
    Elizabeth Farley Jan 30, 1961 [id number]        55 y.o.  [id number] Presents complaining of sharp stabbing right breast pain yesterday. None today.  Currently finishing her period. No history of same before. Last mammogram 2012. No palpable abnormalities or nipple discharge.  Past medical history,surgical history, problem list, medications, allergies, family history and social history were all reviewed and documented in the EPIC chart.  Directed ROS with pertinent positives and negatives documented in the history of present illness/assessment and plan.  Exam: [name] assistant Filed Vitals:   07/06/15 0802  BP: 124/84   General appearance:  Normal Both breast examined lying and sitting without masses, retractions, discharge, adenopathy  Assessment/Plan:  55 y.o. [id number] with fleeting right breast pain.  Schedule diagnostic mammography. Assuming mammogram normal, monitor at present and if remains without further pain then follow expectantly. If persists then represent for further evaluation.    [name] MD, 8:14 AM 07/06/2015

## 2015-07-06 NOTE — Telephone Encounter (Signed)
Orders placed they will contact pt to schedule. 

## 2015-07-15 DIAGNOSIS — N63 Unspecified lump in breast: Secondary | ICD-10-CM | POA: Diagnosis not present

## 2015-07-15 DIAGNOSIS — Z1231 Encounter for screening mammogram for malignant neoplasm of breast: Secondary | ICD-10-CM | POA: Diagnosis not present

## 2015-07-16 ENCOUNTER — Encounter: Payer: Self-pay | Admitting: Anesthesiology

## 2015-08-23 ENCOUNTER — Ambulatory Visit (INDEPENDENT_AMBULATORY_CARE_PROVIDER_SITE_OTHER): Payer: 59 | Admitting: Gynecology

## 2015-08-23 ENCOUNTER — Encounter: Payer: Self-pay | Admitting: Gynecology

## 2015-08-23 VITALS — BP 122/76 | Ht 66.0 in | Wt 249.0 lb

## 2015-08-23 DIAGNOSIS — Z13220 Encounter for screening for lipoid disorders: Secondary | ICD-10-CM

## 2015-08-23 DIAGNOSIS — Z01419 Encounter for gynecological examination (general) (routine) without abnormal findings: Secondary | ICD-10-CM

## 2015-08-23 DIAGNOSIS — Z1321 Encounter for screening for nutritional disorder: Secondary | ICD-10-CM | POA: Diagnosis not present

## 2015-08-23 DIAGNOSIS — Z1329 Encounter for screening for other suspected endocrine disorder: Secondary | ICD-10-CM

## 2015-08-23 LAB — CBC WITH DIFFERENTIAL/PLATELET
Basophils Absolute: 0 cells/uL (ref 0–200)
Basophils Relative: 0 %
Eosinophils Absolute: 160 cells/uL (ref 15–500)
Eosinophils Relative: 2 %
HEMATOCRIT: 37.1 % (ref 35.0–45.0)
Hemoglobin: 12 g/dL (ref 11.7–15.5)
LYMPHS PCT: 26 %
Lymphs Abs: 2080 cells/uL (ref 850–3900)
MCH: 25.8 pg — ABNORMAL LOW (ref 27.0–33.0)
MCHC: 32.3 g/dL (ref 32.0–36.0)
MCV: 79.6 fL — AB (ref 80.0–100.0)
MONOS PCT: 7 %
MPV: 10.5 fL (ref 7.5–12.5)
Monocytes Absolute: 560 cells/uL (ref 200–950)
NEUTROS ABS: 5200 {cells}/uL (ref 1500–7800)
Neutrophils Relative %: 65 %
Platelets: 368 10*3/uL (ref 140–400)
RBC: 4.66 MIL/uL (ref 3.80–5.10)
RDW: 15.8 % — AB (ref 11.0–15.0)
WBC: 8 10*3/uL (ref 3.8–10.8)

## 2015-08-23 LAB — URINALYSIS W MICROSCOPIC + REFLEX CULTURE
BILIRUBIN URINE: NEGATIVE
Casts: NONE SEEN [LPF]
Crystals: NONE SEEN [HPF]
Glucose, UA: NEGATIVE
Hgb urine dipstick: NEGATIVE
KETONES UR: NEGATIVE
LEUKOCYTES UA: NEGATIVE
NITRITE: NEGATIVE
Protein, ur: NEGATIVE
RBC / HPF: NONE SEEN RBC/HPF (ref <=2)
SPECIFIC GRAVITY, URINE: 1.023 (ref 1.001–1.035)
YEAST: NONE SEEN [HPF]
pH: 5.5 (ref 5.0–8.0)

## 2015-08-23 LAB — HEMOGLOBIN A1C
HEMOGLOBIN A1C: 5.9 % — AB (ref <5.7)
Mean Plasma Glucose: 123 mg/dL

## 2015-08-23 LAB — COMPREHENSIVE METABOLIC PANEL
ALK PHOS: 73 U/L (ref 33–130)
ALT: 20 U/L (ref 6–29)
AST: 18 U/L (ref 10–35)
Albumin: 4 g/dL (ref 3.6–5.1)
BUN: 13 mg/dL (ref 7–25)
CALCIUM: 9 mg/dL (ref 8.6–10.4)
CO2: 24 mmol/L (ref 20–31)
Chloride: 103 mmol/L (ref 98–110)
Creat: 0.76 mg/dL (ref 0.50–1.05)
GLUCOSE: 97 mg/dL (ref 65–99)
POTASSIUM: 4.4 mmol/L (ref 3.5–5.3)
Sodium: 138 mmol/L (ref 135–146)
Total Bilirubin: 0.3 mg/dL (ref 0.2–1.2)
Total Protein: 6.7 g/dL (ref 6.1–8.1)

## 2015-08-23 LAB — VITAMIN D 25 HYDROXY (VIT D DEFICIENCY, FRACTURES): Vit D, 25-Hydroxy: 15 ng/mL — ABNORMAL LOW (ref 30–100)

## 2015-08-23 LAB — LIPID PANEL
CHOL/HDL RATIO: 3.8 ratio (ref <=5.0)
Cholesterol: 169 mg/dL (ref 125–200)
HDL: 45 mg/dL — AB (ref >=46)
LDL Cholesterol: 93 mg/dL (ref <130)
Triglycerides: 155 mg/dL — ABNORMAL HIGH (ref <150)
VLDL: 31 mg/dL — ABNORMAL HIGH (ref <30)

## 2015-08-23 LAB — TSH: TSH: 5.23 mIU/L — ABNORMAL HIGH

## 2015-08-23 NOTE — Patient Instructions (Signed)
Schedule your colonoscopy with either:  [facility]   Address: [address], [place], [place] [address]  [phone]    or  [facility]  Address: [address], [place], [place] [address]  [phone]      You may obtain a copy of any labs that were done today by logging onto MyChart as outlined in the instructions provided with your AVS (after visit summary). The office will not call with normal lab results but certainly if there are any significant abnormalities then we will contact you.   Health Maintenance Adopting a healthy lifestyle and getting preventive care can go a long way to promote health and wellness. Talk with your health care provider about what schedule of regular examinations is right for you. This is a good chance for you to check in with your provider about disease prevention and staying healthy. In between checkups, there are plenty of things you can do on your own. Experts have done a lot of research about which lifestyle changes and preventive measures are most likely to keep you healthy. Ask your health care provider for more information. WEIGHT AND DIET  Eat a healthy diet  Be sure to include plenty of vegetables, fruits, low-fat dairy products, and lean protein.  Do not eat a lot of foods high in solid fats, added sugars, or salt.  Get regular exercise. This is one of the most important things you can do for your health.  Most adults should exercise for at least 150 minutes each week. The exercise should increase your heart rate and make you sweat (moderate-intensity exercise).  Most adults should also do strengthening exercises at least twice a week. This is in addition to the moderate-intensity exercise.  Maintain a healthy weight  Body mass index (BMI) is a measurement that can be used to identify possible weight problems. It estimates body fat based on height and weight. Your health care provider can help determine  your BMI and help you achieve or maintain a healthy weight.  For females 22 years of age and older:   A BMI below 18.5 is considered underweight.  A BMI of 18.5 to 24.9 is normal.  A BMI of 25 to 29.9 is considered overweight.  A BMI of 30 and above is considered obese.  Watch levels of cholesterol and blood lipids  You should start having your blood tested for lipids and cholesterol at 53 years of age, then have this test every 5 years.  You may need to have your cholesterol levels checked more often if:  Your lipid or cholesterol levels are high.  You are older than 53 years of age.  You are at high risk for heart disease.  CANCER SCREENING   Lung Cancer  Lung cancer screening is recommended for adults 19-51 years old who are at high risk for lung cancer because of a history of smoking.  A yearly low-dose CT scan of the lungs is recommended for people who:  Currently smoke.  Have quit within the past 15 years.  Have at least a 30-pack-year history of smoking. A pack year is smoking an average of one pack of cigarettes a day for 1 year.  Yearly screening should continue until it has been 15 years since you quit.  Yearly screening should stop if you develop a health problem that would prevent you from having lung cancer treatment.  Breast Cancer  Practice breast self-awareness. This means understanding how your breasts normally appear and  feel.  It also means doing regular breast self-exams. Let your health care provider know about any changes, no matter how small.  If you are in your 20s or 30s, you should have a clinical breast exam (CBE) by a health care provider every 1-3 years as part of a regular health exam.  If you are 35 or older, have a CBE every year. Also consider having a breast X-ray (mammogram) every year.  If you have a family history of breast cancer, talk to your health care provider about genetic screening.  If you are at high risk for breast  cancer, talk to your health care provider about having an MRI and a mammogram every year.  Breast cancer gene (BRCA) assessment is recommended for women who have family members with BRCA-related cancers. BRCA-related cancers include:  Breast.  Ovarian.  Tubal.  Peritoneal cancers.  Results of the assessment will determine the need for genetic counseling and BRCA1 and BRCA2 testing. Cervical Cancer Routine pelvic examinations to screen for cervical cancer are no longer recommended for nonpregnant women who are considered low risk for cancer of the pelvic organs (ovaries, uterus, and vagina) and who do not have symptoms. A pelvic examination may be necessary if you have symptoms including those associated with pelvic infections. Ask your health care provider if a screening pelvic exam is right for you.   The Pap test is the screening test for cervical cancer for women who are considered at risk.  If you had a hysterectomy for a problem that was not cancer or a condition that could lead to cancer, then you no longer need Pap tests.  If you are older than 65 years, and you have had normal Pap tests for the past 10 years, you no longer need to have Pap tests.  If you have had past treatment for cervical cancer or a condition that could lead to cancer, you need Pap tests and screening for cancer for at least 20 years after your treatment.  If you no longer get a Pap test, assess your risk factors if they change (such as having a new sexual partner). This can affect whether you should start being screened again.  Some women have medical problems that increase their chance of getting cervical cancer. If this is the case for you, your health care provider may recommend more frequent screening and Pap tests.  The human papillomavirus (HPV) test is another test that may be used for cervical cancer screening. The HPV test looks for the virus that can cause cell changes in the cervix. The cells  collected during the Pap test can be tested for HPV.  The HPV test can be used to screen women 72 years of age and older. Getting tested for HPV can extend the interval between normal Pap tests from three to five years.  An HPV test also should be used to screen women of any age who have unclear Pap test results.  After 53 years of age, women should have HPV testing as often as Pap tests.  Colorectal Cancer  This type of cancer can be detected and often prevented.  Routine colorectal cancer screening usually begins at 53 years of age and continues through 53 years of age.  Your health care provider may recommend screening at an earlier age if you have risk factors for colon cancer.  Your health care provider may also recommend using home test kits to check for hidden blood in the stool.  A small camera  at the end of a tube can be used to examine your colon directly (sigmoidoscopy or colonoscopy). This is done to check for the earliest forms of colorectal cancer.  Routine screening usually begins at age 40.  Direct examination of the colon should be repeated every 5-10 years through 53 years of age. However, you may need to be screened more often if early forms of precancerous polyps or small growths are found. Skin Cancer  Check your skin from head to toe regularly.  Tell your health care provider about any new moles or changes in moles, especially if there is a change in a mole's shape or color.  Also tell your health care provider if you have a mole that is larger than the size of a pencil eraser.  Always use sunscreen. Apply sunscreen liberally and repeatedly throughout the day.  Protect yourself by wearing long sleeves, pants, a wide-brimmed hat, and sunglasses whenever you are outside. HEART DISEASE, DIABETES, AND HIGH BLOOD PRESSURE   Have your blood pressure checked at least every 1-2 years. High blood pressure causes heart disease and increases the risk of stroke.  If  you are between 74 years and 72 years old, ask your health care provider if you should take aspirin to prevent strokes.  Have regular diabetes screenings. This involves taking a blood sample to check your fasting blood sugar level.  If you are at a normal weight and have a low risk for diabetes, have this test once every three years after 53 years of age.  If you are overweight and have a high risk for diabetes, consider being tested at a younger age or more often. PREVENTING INFECTION  Hepatitis B  If you have a higher risk for hepatitis B, you should be screened for this virus. You are considered at high risk for hepatitis B if:  You were born in a country where hepatitis B is common. Ask your health care provider which countries are considered high risk.  Your parents were born in a high-risk country, and you have not been immunized against hepatitis B (hepatitis B vaccine).  You have HIV or AIDS.  You use needles to inject street drugs.  You live with someone who has hepatitis B.  You have had sex with someone who has hepatitis B.  You get hemodialysis treatment.  You take certain medicines for conditions, including cancer, organ transplantation, and autoimmune conditions. Hepatitis C  Blood testing is recommended for:  Everyone born from [birth year] through 1963.  Anyone with known risk factors for hepatitis C. Sexually transmitted infections (STIs)  You should be screened for sexually transmitted infections (STIs) including gonorrhea and chlamydia if:  You are sexually active and are younger than 53 years of age.  You are older than 53 years of age and your health care provider tells you that you are at risk for this type of infection.  Your sexual activity has changed since you were last screened and you are at an increased risk for chlamydia or gonorrhea. Ask your health care provider if you are at risk.  If you do not have HIV, but are at risk, it may be recommended that  you take a prescription medicine daily to prevent HIV infection. This is called pre-exposure prophylaxis (PrEP). You are considered at risk if:  You are sexually active and do not regularly use condoms or know the HIV status of your partner(s).  You take drugs by injection.  You are sexually active with a partner  who has HIV. Talk with your health care provider about whether you are at high risk of being infected with HIV. If you choose to begin PrEP, you should first be tested for HIV. You should then be tested every 3 months for as long as you are taking PrEP.  PREGNANCY   If you are premenopausal and you may become pregnant, ask your health care provider about preconception counseling.  If you may become pregnant, take 400 to 800 micrograms (mcg) of folic acid every day.  If you want to prevent pregnancy, talk to your health care provider about birth control (contraception). OSTEOPOROSIS AND MENOPAUSE   Osteoporosis is a disease in which the bones lose minerals and strength with aging. This can result in serious bone fractures. Your risk for osteoporosis can be identified using a bone density scan.  If you are 8 years of age or older, or if you are at risk for osteoporosis and fractures, ask your health care provider if you should be screened.  Ask your health care provider whether you should take a calcium or vitamin D supplement to lower your risk for osteoporosis.  Menopause may have certain physical symptoms and risks.  Hormone replacement therapy may reduce some of these symptoms and risks. Talk to your health care provider about whether hormone replacement therapy is right for you.  HOME CARE INSTRUCTIONS   Schedule regular health, dental, and eye exams.  Stay current with your immunizations.   Do not use any tobacco products including cigarettes, chewing tobacco, or electronic cigarettes.  If you are pregnant, do not drink alcohol.  If you are breastfeeding, limit how  much and how often you drink alcohol.  Limit alcohol intake to no more than 1 drink per day for nonpregnant women. One drink equals 12 ounces of beer, 5 ounces of wine, or 1 ounces of hard liquor.  Do not use street drugs.  Do not share needles.  Ask your health care provider for help if you need support or information about quitting drugs.  Tell your health care provider if you often feel depressed.  Tell your health care provider if you have ever been abused or do not feel safe at home. Document Released: 10/24/2010 Document Revised: 08/25/2013 Document Reviewed: 03/12/2013 [facility] Patient Information 2015 [place], [place]. This information is not intended to replace advice given to you by your health care provider. Make sure you discuss any questions you have with your health care provider.

## 2015-08-23 NOTE — Progress Notes (Signed)
    AYSSA KRENZ [birth year] [id number]        55 y.o.  [id number]  for annual exam.  Doing well without complaints  Past medical history,surgical history, problem list, medications, allergies, family history and social history were all reviewed and documented as reviewed in the EPIC chart.  ROS:  Performed with pertinent positives and negatives included in the history, assessment and plan.   Additional significant findings :  none   Exam: [name] assistant Filed Vitals:   08/23/15 0754  BP: 122/76  Height: 5\' 6"  (1.676 m)  Weight: 249 lb (112.946 kg)   General appearance:  Normal affect, orientation and appearance. Skin: Grossly normal HEENT: Without gross lesions.  No cervical or supraclavicular adenopathy. Thyroid normal.  Lungs:  Clear without wheezing, rales or rhonchi Cardiac: RR, without RMG Abdominal:  Soft, nontender, without masses, guarding, rebound, organomegaly or hernia Breasts:  Examined lying and sitting without masses, retractions, discharge or axillary adenopathy. Pelvic:  Ext/BUS/vagina normal  Cervix normal  Uterus anteverted, normal size, shape and contour, midline and mobile nontender   Adnexa without masses or tenderness    Anus and perineum normal   Rectovaginal normal sphincter tone without palpated masses or tenderness.    Assessment/Plan:  55 y.o. G23P0050 female for annual exam with regular menses, tubal sterilization.   1. Regular menses. No significant hot flushes, night sweats or other menopausal symptoms. Continue to monitor menses for now. Call if significant irregular bleeding. 2. Mammography 06/2015. Several new small complex cysts noted. Has follow up mammography/ultrasound scheduled in 6 months. SBE monthly reviewed. 3. Pap smear 07/2014. No Pap smear done today. No history of significant abnormal Pap smears. Plan repeat Pap smear at 3 year interval. 4. Colonoscopy never. Patient plans to schedule this coming fall. 5. Health maintenance.  Baseline CBC, CMP, lipid profile, hemoglobin A1c, TSH, vitamin D, urinalysis ordered. Follow up one year, sooner as needed.   [name] MD, 8:16 AM 08/23/2015

## 2015-08-24 ENCOUNTER — Other Ambulatory Visit: Payer: Self-pay | Admitting: Gynecology

## 2015-08-24 DIAGNOSIS — R7989 Other specified abnormal findings of blood chemistry: Secondary | ICD-10-CM

## 2015-08-24 LAB — URINE CULTURE: Colony Count: 10000

## 2015-08-25 ENCOUNTER — Other Ambulatory Visit: Payer: Self-pay | Admitting: Gynecology

## 2015-08-25 DIAGNOSIS — E559 Vitamin D deficiency, unspecified: Secondary | ICD-10-CM

## 2015-08-25 MED ORDER — VITAMIN D (ERGOCALCIFEROL) 1.25 MG (50000 UNIT) PO CAPS: 50000.0000 [IU] | ORAL_CAPSULE | ORAL | Status: DC

## 2015-08-30 ENCOUNTER — Other Ambulatory Visit: Payer: 59

## 2015-08-30 DIAGNOSIS — R7989 Other specified abnormal findings of blood chemistry: Secondary | ICD-10-CM

## 2015-08-30 DIAGNOSIS — R946 Abnormal results of thyroid function studies: Secondary | ICD-10-CM | POA: Diagnosis not present

## 2015-08-30 LAB — THYROID PANEL WITH TSH
Free Thyroxine Index: 2.1 (ref 1.4–3.8)
T3 UPTAKE: 25 % (ref 22–35)
T4, Total: 8.4 ug/dL (ref 4.5–12.0)
TSH: 4 mIU/L

## 2016-02-29 ENCOUNTER — Encounter: Payer: Self-pay | Admitting: Gynecology

## 2016-02-29 DIAGNOSIS — N6012 Diffuse cystic mastopathy of left breast: Secondary | ICD-10-CM | POA: Diagnosis not present

## 2016-03-28 DIAGNOSIS — H52223 Regular astigmatism, bilateral: Secondary | ICD-10-CM | POA: Diagnosis not present

## 2016-03-28 DIAGNOSIS — H524 Presbyopia: Secondary | ICD-10-CM | POA: Diagnosis not present

## 2016-03-28 DIAGNOSIS — H5203 Hypermetropia, bilateral: Secondary | ICD-10-CM | POA: Diagnosis not present

## 2016-03-28 DIAGNOSIS — H35462 Secondary vitreoretinal degeneration, left eye: Secondary | ICD-10-CM | POA: Diagnosis not present

## 2016-07-10 ENCOUNTER — Encounter: Payer: Self-pay | Admitting: Gynecology

## 2016-07-10 DIAGNOSIS — Z1231 Encounter for screening mammogram for malignant neoplasm of breast: Secondary | ICD-10-CM | POA: Diagnosis not present

## 2017-03-30 ENCOUNTER — Encounter: Payer: Self-pay | Admitting: Gynecology

## 2017-03-30 ENCOUNTER — Ambulatory Visit: Payer: 59 | Admitting: Gynecology

## 2017-03-30 VITALS — BP 122/82

## 2017-03-30 DIAGNOSIS — N926 Irregular menstruation, unspecified: Secondary | ICD-10-CM | POA: Diagnosis not present

## 2017-03-30 MED ORDER — MEDROXYPROGESTERONE ACETATE 10 MG PO TABS: 10.0000 mg | ORAL_TABLET | Freq: Every day | ORAL | 0 refills | Status: DC

## 2017-03-30 NOTE — Progress Notes (Signed)
    Elizabeth Farley 1960-10-02 [id number]        56 y.o.  [id number] presents having had regular monthly menses over the past year until her last menstrual period started 16 November and she is bled on and off since then.  She will stop for a day or so and then start bleeding again.  Not having significant cramping.  Not having hot flushes or night sweats.  Menses have varied and heaviness from month to month but occur monthly with no intermenstrual bleeding.  No significant weight changes hair changes nausea vomiting diarrhea constipation.  Status post tubal sterilization  Past medical history,surgical history, problem list, medications, allergies, family history and social history were all reviewed and documented in the EPIC chart.  Directed ROS with pertinent positives and negatives documented in the history of present illness/assessment and plan.  Exam: Elizabeth Farley assistant Vitals:   03/30/17 1234  BP: 122/82   General appearance:  Normal Abdomen soft nontender without masses guarding rebound Pelvic external BUS vagina with light bleeding.  Cervix normal.  Uterus difficult to palpate but grossly normal midline mobile nontender.  Adnexa without mass or tenderness.  Assessment/Plan:  56 y.o. [id number] with episode of prolonged bleeding.  She does have a history of endometrial polyps in the past in 2016 with hysteroscopic resection.  She has had regular monthly menses proceeding this will start with a progesterone withdrawal and treat as ovulatory dysfunction at this point.  Provera 10 mg daily times 10 days.  She will follow-up if the irregular bleeding continues.  She does have an appointment to see me in January for her annual exam.  If the irregular bleeding continues then we will pursue a more involved evaluation to include sonohysterogram.  We will check baseline CBC given her bleeding and FSH and TSH.    Elizabeth Auerbach MD, 12:43 PM 03/30/2017

## 2017-03-30 NOTE — Patient Instructions (Addendum)
Take the prescribed medication daily for 10 days.  Call me if irregular bleeding continues.  Follow-up for your scheduled annual exam appointment in January

## 2017-03-31 LAB — CBC WITH DIFFERENTIAL/PLATELET
BASOS ABS: 63 {cells}/uL (ref 0–200)
Basophils Relative: 0.7 %
EOS ABS: 261 {cells}/uL (ref 15–500)
Eosinophils Relative: 2.9 %
HCT: 37.5 % (ref 35.0–45.0)
HEMOGLOBIN: 11.9 g/dL (ref 11.7–15.5)
Lymphs Abs: 2178 cells/uL (ref 850–3900)
MCH: 24.6 pg — AB (ref 27.0–33.0)
MCHC: 31.7 g/dL — AB (ref 32.0–36.0)
MCV: 77.5 fL — AB (ref 80.0–100.0)
MPV: 11.3 fL (ref 7.5–12.5)
Monocytes Relative: 7.8 %
Neutro Abs: 5796 cells/uL (ref 1500–7800)
Neutrophils Relative %: 64.4 %
PLATELETS: 335 10*3/uL (ref 140–400)
RBC: 4.84 10*6/uL (ref 3.80–5.10)
RDW: 15.3 % — ABNORMAL HIGH (ref 11.0–15.0)
TOTAL LYMPHOCYTE: 24.2 %
WBC mixed population: 702 cells/uL (ref 200–950)
WBC: 9 10*3/uL (ref 3.8–10.8)

## 2017-03-31 LAB — TSH: TSH: 4.17 mIU/L

## 2017-03-31 LAB — FOLLICLE STIMULATING HORMONE: FSH: 7.2 m[IU]/mL

## 2017-04-18 ENCOUNTER — Telehealth: Payer: Self-pay | Admitting: *Deleted

## 2017-04-18 DIAGNOSIS — N926 Irregular menstruation, unspecified: Secondary | ICD-10-CM

## 2017-04-18 MED ORDER — MEGESTROL ACETATE 20 MG PO TABS: 20.0000 mg | ORAL_TABLET | Freq: Two times a day (BID) | ORAL | 0 refills | Status: DC

## 2017-04-18 NOTE — Telephone Encounter (Signed)
1.  Schedule sonohysterogram 2.  Megace 20 mg twice daily until bleeding resolves then stop.  #30

## 2017-04-18 NOTE — Telephone Encounter (Signed)
Patient aware, order placed for [facility], front desk to schedule, Rx sent.

## 2017-04-18 NOTE — Telephone Encounter (Signed)
Patient called to follow up from [place] on 03/30/17 states she bled 3 days after visit, completed provera 10mg  x 10 days, bleeding then stopped, started bleeding again on 04/12/17 changing pad every hour and a half with clots, light cramping. You mentioned next step [facility], Rx to help with bleeding also? Please advise

## 2017-04-26 ENCOUNTER — Other Ambulatory Visit: Payer: Self-pay | Admitting: Gynecology

## 2017-04-26 DIAGNOSIS — N939 Abnormal uterine and vaginal bleeding, unspecified: Secondary | ICD-10-CM

## 2017-05-14 ENCOUNTER — Ambulatory Visit (INDEPENDENT_AMBULATORY_CARE_PROVIDER_SITE_OTHER): Payer: 59 | Admitting: Gynecology

## 2017-05-14 ENCOUNTER — Encounter: Payer: Self-pay | Admitting: Gynecology

## 2017-05-14 VITALS — BP 118/76 | Ht 66.0 in | Wt 251.0 lb

## 2017-05-14 DIAGNOSIS — Z1151 Encounter for screening for human papillomavirus (HPV): Secondary | ICD-10-CM | POA: Diagnosis not present

## 2017-05-14 DIAGNOSIS — Z01411 Encounter for gynecological examination (general) (routine) with abnormal findings: Secondary | ICD-10-CM | POA: Diagnosis not present

## 2017-05-14 DIAGNOSIS — Z13220 Encounter for screening for lipoid disorders: Secondary | ICD-10-CM | POA: Diagnosis not present

## 2017-05-14 DIAGNOSIS — E669 Obesity, unspecified: Secondary | ICD-10-CM

## 2017-05-14 DIAGNOSIS — N926 Irregular menstruation, unspecified: Secondary | ICD-10-CM

## 2017-05-14 NOTE — Addendum Note (Signed)
Addended by: Nelva Nay on: 05/14/2017 12:51 PM   Modules accepted: Orders

## 2017-05-14 NOTE — Patient Instructions (Signed)
Follow up for ultrasound as scheduled 

## 2017-05-14 NOTE — Progress Notes (Signed)
    RHYAN WOLTERS 07/13/1960 [id number]        57 y.o.  [id number] for annual gynecologic exam.  Started bleeding today.  History of irregular bleeding starting in November.  Treated with Provera and subsequently Megace.  Has sonohysterogram scheduled end of this month.  Past medical history,surgical history, problem list, medications, allergies, family history and social history were all reviewed and documented as reviewed in the EPIC chart.  ROS:  Performed with pertinent positives and negatives included in the history, assessment and plan.   Additional significant findings : None   Exam: [name] assistant Vitals:   05/14/17 1216  BP: 118/76  Weight: 251 lb (113.9 kg)  Height: 5\' 6"  (1.676 m)   Body mass index is 40.51 kg/m.  General appearance:  Normal affect, orientation and appearance. Skin: Grossly normal HEENT: Without gross lesions.  No cervical or supraclavicular adenopathy. Thyroid normal.  Lungs:  Clear without wheezing, rales or rhonchi Cardiac: RR, without RMG Abdominal:  Soft, nontender, without masses, guarding, rebound, organomegaly or hernia Breasts:  Examined lying and sitting without masses, retractions, discharge or axillary adenopathy. Pelvic:  Ext, BUS, Vagina: Normal with light menses flow  Cervix: Normal with light menses flow.  Pap smear done  Uterus: Anteverted, normal size, shape and contour, midline and mobile nontender   Adnexa: Without masses or tenderness    Anus and perineum: Normal   Rectovaginal: Normal sphincter tone without palpated masses or tenderness.    Assessment/Plan:  57 y.o. G15P0050 female for annual gynecologic exam with irregular menses, tubal sterilization.   1. Irregular menses since November.  Started bleeding today.  We will see if this is not a normal menses.  If continues to bleed past 5 days she will call for progesterone treatment.  Had sonohysterogram scheduled and will follow-up for this.  Recent TSH and FSH were  normal.  Hemoglobin was 11 and she is to start on extra iron but has not done so yet but has at home and will go ahead and do so. 2. Mammography 06/2016.  Continue with annual mammography when due.  Breast exam normal today.  SBE monthly reviewed. 3. Colonoscopy never.  I again stressed the need to schedule a screening colonoscopy.  Second most common cancer in women reviewed.  Patient agrees to schedule. 4. Pap smear 2016.  Pap smear/HPV done today.  No history of abnormal Pap smears previously. 5. Health maintenance.  Baseline comprehensive metabolic panel, lipid profile and hemoglobin A1c ordered.  CBC and TSH recently done.  Follow-up for sonohysterogram as scheduled.  Follow-up in 1 year for annual exam.   [name] MD, 12:39 PM 05/14/2017

## 2017-05-16 ENCOUNTER — Other Ambulatory Visit: Payer: 59

## 2017-05-16 ENCOUNTER — Ambulatory Visit: Payer: 59 | Admitting: Gynecology

## 2017-05-16 LAB — PAP IG AND HPV HIGH-RISK: HPV DNA HIGH RISK: NOT DETECTED

## 2017-05-23 ENCOUNTER — Ambulatory Visit (INDEPENDENT_AMBULATORY_CARE_PROVIDER_SITE_OTHER): Payer: 59

## 2017-05-23 ENCOUNTER — Other Ambulatory Visit: Payer: Self-pay | Admitting: Gynecology

## 2017-05-23 ENCOUNTER — Ambulatory Visit (INDEPENDENT_AMBULATORY_CARE_PROVIDER_SITE_OTHER): Payer: 59 | Admitting: Gynecology

## 2017-05-23 ENCOUNTER — Encounter: Payer: Self-pay | Admitting: Gynecology

## 2017-05-23 VITALS — BP 122/80

## 2017-05-23 DIAGNOSIS — N939 Abnormal uterine and vaginal bleeding, unspecified: Secondary | ICD-10-CM | POA: Diagnosis not present

## 2017-05-23 DIAGNOSIS — N852 Hypertrophy of uterus: Secondary | ICD-10-CM

## 2017-05-23 DIAGNOSIS — D251 Intramural leiomyoma of uterus: Secondary | ICD-10-CM | POA: Diagnosis not present

## 2017-05-23 DIAGNOSIS — N949 Unspecified condition associated with female genital organs and menstrual cycle: Secondary | ICD-10-CM | POA: Diagnosis not present

## 2017-05-23 DIAGNOSIS — N9489 Other specified conditions associated with female genital organs and menstrual cycle: Secondary | ICD-10-CM

## 2017-05-23 DIAGNOSIS — D259 Leiomyoma of uterus, unspecified: Secondary | ICD-10-CM | POA: Diagnosis not present

## 2017-05-23 DIAGNOSIS — N926 Irregular menstruation, unspecified: Secondary | ICD-10-CM | POA: Diagnosis not present

## 2017-05-23 MED ORDER — MEGESTROL ACETATE 40 MG PO TABS: 40.0000 mg | ORAL_TABLET | Freq: Every day | ORAL | 1 refills | Status: DC

## 2017-05-23 NOTE — Progress Notes (Signed)
    Elizabeth Farley Sep 08, 1960 [id number]        57 y.o.  [id number] presents for sonohysterogram.  History of regular monthly menses through November and then bleeding on and off since.  Treated with Provera and then Megace.  Has continued to bleed on and off since then.  History of endometrial polyps in the past.  Status post tubal sterilization.  Past medical history,surgical history, problem list, medications, allergies, family history and social history were all reviewed and documented in the EPIC chart.  Directed ROS with pertinent positives and negatives documented in the history of present illness/assessment and plan.  Exam: [place] assistant BP 122/80 General appearance:  Normal Abdomen soft nontender without masses guarding rebound Pelvic external BUS vagina normal.  Light bleeding noted.  Cervix normal.  Uterus grossly normal midline mobile nontender.  Adnexa without mass.  Ultrasound transvaginal and transabdominal shows uterus enlarged with multiple myomas.  35 mm, 33 mm, 21 mm, 21 mm, 19 mm, 17 mm.  Right ovary with thin-walled echo-free cyst 29 mm.  Left ovary normal.  Left adnexal mass cystic/solid with color-flow questionable leiomyoma versus ovarian cyst 25 x 21 mm.  Cul-de-sac negative  Sonohysterogram performed, sterile technique, easy catheter introduction, good distention with no abnormalities.  Endometrial sample taken.  Patient tolerated well.  Assessment/Plan:  57 y.o. [id number] continued irregular bleeding in the perimenopausal timeframe.  Sonohysterogram without evidence of polyps.  Patient will follow-up for the endometrial biopsy.  Has been off Megace now for several weeks.  Will treat with Megace 40 mg daily times 10 days as a withdrawal.  Will call me if irregular bleeding continues.  Recommended follow-up ultrasound in 3-4 months to relook at the left adnexal area if and patient agrees to call and schedule.  I reviewed all of her ultrasound findings with her and she  knows the importance of follow-up.    [name] MD, 4:07 PM 05/23/2017

## 2017-05-23 NOTE — Patient Instructions (Signed)
Take the Megace tablet daily for 10 days.  Call if irregular bleeding continues.  Call to schedule an ultrasound in 3-4 months.

## 2017-06-21 ENCOUNTER — Encounter (HOSPITAL_COMMUNITY): Payer: Self-pay

## 2017-06-21 ENCOUNTER — Emergency Department (HOSPITAL_COMMUNITY): Payer: 59

## 2017-06-21 ENCOUNTER — Telehealth: Payer: Self-pay | Admitting: *Deleted

## 2017-06-21 ENCOUNTER — Emergency Department (HOSPITAL_COMMUNITY)
Admission: EM | Admit: 2017-06-21 | Discharge: 2017-06-21 | Disposition: A | Discharge status: Home / Self Care | Payer: 59 | Attending: Emergency Medicine | Admitting: Emergency Medicine

## 2017-06-21 DIAGNOSIS — R1031 Right lower quadrant pain: Secondary | ICD-10-CM | POA: Diagnosis not present

## 2017-06-21 DIAGNOSIS — R109 Unspecified abdominal pain: Secondary | ICD-10-CM | POA: Diagnosis not present

## 2017-06-21 DIAGNOSIS — Z79899 Other long term (current) drug therapy: Secondary | ICD-10-CM | POA: Diagnosis not present

## 2017-06-21 DIAGNOSIS — E039 Hypothyroidism, unspecified: Secondary | ICD-10-CM | POA: Diagnosis not present

## 2017-06-21 LAB — CBC WITH DIFFERENTIAL/PLATELET
Basophils Absolute: 0 10*3/uL (ref 0.0–0.1)
Basophils Relative: 0 %
EOS ABS: 0.3 10*3/uL (ref 0.0–0.7)
EOS PCT: 3 %
HCT: 37.3 % (ref 36.0–46.0)
Hemoglobin: 11.2 g/dL — ABNORMAL LOW (ref 12.0–15.0)
LYMPHS ABS: 2 10*3/uL (ref 0.7–4.0)
Lymphocytes Relative: 21 %
MCH: 22.7 pg — AB (ref 26.0–34.0)
MCHC: 30 g/dL (ref 30.0–36.0)
MCV: 75.5 fL — AB (ref 78.0–100.0)
MONO ABS: 0.5 10*3/uL (ref 0.1–1.0)
MONOS PCT: 5 %
Neutro Abs: 6.8 10*3/uL (ref 1.7–7.7)
Neutrophils Relative %: 71 %
PLATELETS: 379 10*3/uL (ref 150–400)
RBC: 4.94 MIL/uL (ref 3.87–5.11)
RDW: 16.4 % — AB (ref 11.5–15.5)
WBC: 9.6 10*3/uL (ref 4.0–10.5)

## 2017-06-21 LAB — COMPREHENSIVE METABOLIC PANEL
ALT: 21 U/L (ref 14–54)
ANION GAP: 9 (ref 5–15)
AST: 21 U/L (ref 15–41)
Albumin: 3.8 g/dL (ref 3.5–5.0)
Alkaline Phosphatase: 56 U/L (ref 38–126)
BUN: 9 mg/dL (ref 6–20)
CO2: 23 mmol/L (ref 22–32)
CREATININE: 0.83 mg/dL (ref 0.44–1.00)
Calcium: 9 mg/dL (ref 8.9–10.3)
Chloride: 103 mmol/L (ref 101–111)
Glucose, Bld: 111 mg/dL — ABNORMAL HIGH (ref 65–99)
Potassium: 3.5 mmol/L (ref 3.5–5.1)
Sodium: 135 mmol/L (ref 135–145)
Total Bilirubin: 0.2 mg/dL — ABNORMAL LOW (ref 0.3–1.2)
Total Protein: 7.4 g/dL (ref 6.5–8.1)

## 2017-06-21 LAB — URINALYSIS, ROUTINE W REFLEX MICROSCOPIC
BILIRUBIN URINE: NEGATIVE
GLUCOSE, UA: NEGATIVE mg/dL
HGB URINE DIPSTICK: NEGATIVE
KETONES UR: NEGATIVE mg/dL
Leukocytes, UA: NEGATIVE
Nitrite: NEGATIVE
PROTEIN: NEGATIVE mg/dL
Specific Gravity, Urine: 1.015 (ref 1.005–1.030)
pH: 5 (ref 5.0–8.0)

## 2017-06-21 LAB — PREGNANCY, URINE: PREG TEST UR: NEGATIVE

## 2017-06-21 MED ORDER — TRAMADOL HCL 50 MG PO TABS
50.0000 mg | ORAL_TABLET | Freq: Once | ORAL | Status: AC
  Administered 2017-06-21: 50 mg via ORAL
  Filled 2017-06-21: qty 1

## 2017-06-21 MED ORDER — IOPAMIDOL (ISOVUE-300) INJECTION 61%
100.0000 mL | Freq: Once | INTRAVENOUS | Status: AC | PRN
  Administered 2017-06-21: 100 mL via INTRAVENOUS

## 2017-06-21 MED ORDER — TRAMADOL HCL 50 MG PO TABS: 50.0000 mg | ORAL_TABLET | Freq: Four times a day (QID) | ORAL | 0 refills | Status: DC | PRN

## 2017-06-21 NOTE — Telephone Encounter (Signed)
Pt informed, states the pain has worsened and she is on way to ER

## 2017-06-21 NOTE — Telephone Encounter (Signed)
I doubt that is related to the sonohysterogram months ago.  I would recommend office visit if persists

## 2017-06-21 NOTE — ED Provider Notes (Signed)
[facility] EMERGENCY DEPARTMENT Provider Note   CSN: [id number] Arrival date & time: 06/21/17  1515     History   Chief Complaint Chief Complaint  Patient presents with  . Abdominal Pain    HPI Elizabeth Farley is a 57 y.o. female with past medical history of thyroid disease, surgical history significant for tubal ligation and pelvic laparoscopy x 3 with ectopic pregnancies presenting with a 4 day history of right lower abdominal pain.  She denies n/v/d and dysuria and is currently on her menses. She reports typically has right ovarian pain with her period lasting a day, but todays symptoms are more intense and longer lasting.  She was seen by her gyn last month with similar complaints and an office [place] per pt report was negative for any ovarian pathology.  She was referred to the ed to rule out other sources of her symptoms.   HPI  Past Medical History:  Diagnosis Date  . Thyroid disease    hypothyroid    Patient Active Problem List   Diagnosis Date Noted  . Hypothyroid 03/15/2012    Past Surgical History:  Procedure Laterality Date  . DILATATION & CURETTAGE/HYSTEROSCOPY WITH MYOSURE N/A 08/18/2014   Procedure: DILATATION & CURETTAGE/HYSTEROSCOPY WITH MYOSURE;  Surgeon: [name], MD;  Location: [facility];  Service: Gynecology;  Laterality: N/A;  . NOSE SURGERY    . PELVIC LAPAROSCOPY     x3 due to ectopic pregnancis   . TUBAL LIGATION      OB History    Gravida Para Term Preterm AB Living   5 0 0 0 5 0   SAB TAB Ectopic Multiple Live Births   3 0 2           Home Medications    Prior to Admission medications   Medication Sig Start Date End Date Taking? Authorizing Provider  famotidine (PEPCID) 10 MG tablet Take 10 mg by mouth daily as needed for heartburn or indigestion.    [provider]  ibuprofen (ADVIL,MOTRIN) 200 MG tablet Take 400 mg by mouth every 6 (six) hours as needed for mild pain.    [provider]  medroxyPROGESTERone  (PROVERA) 10 MG tablet Take 1 tablet (10 mg total) by mouth daily. Patient not taking: Reported on 05/14/2017 03/30/17   Fontaine, Belinda Block, MD  megestrol (MEGACE) 40 MG tablet Take 1 tablet (40 mg total) by mouth daily. 05/23/17   Fontaine, Belinda Block, MD  traMADol (ULTRAM) 50 MG tablet Take 1 tablet (50 mg total) by mouth every 6 (six) hours as needed. 06/21/17   Evalee Jefferson, PA-C    Family History Family History  Problem Relation Age of Onset  . Atrial fibrillation Mother   . Heart disease Father   . Diabetes Father     Social History Social History   Tobacco Use  . Smoking status: Never Smoker  . Smokeless tobacco: Never Used  Substance Use Topics  . Alcohol use: No    Alcohol/week: 0.0 oz  . Drug use: No     Allergies   Patient has no known allergies.   Review of Systems Review of Systems  Constitutional: Negative for fever.  HENT: Negative for congestion and sore throat.   Eyes: Negative.   Respiratory: Negative for chest tightness and shortness of breath.   Cardiovascular: Negative for chest pain.  Gastrointestinal: Positive for abdominal pain. Negative for nausea.  Genitourinary: Negative.   Musculoskeletal: Negative for arthralgias, joint swelling and neck pain.  Skin: Negative.  Negative for rash and wound.  Neurological: Negative for dizziness, weakness, light-headedness, numbness and headaches.  Psychiatric/Behavioral: Negative.      Physical Exam Updated Vital Signs BP (!) 146/78 (BP Location: Right Arm)   Pulse 86   Temp 98.5 F (36.9 C) (Oral)   Resp 18   Ht 5\' 7"  (1.702 m)   Wt 112 kg (247 lb)   LMP 06/20/2017 Comment: tubal ligation  SpO2 99%   BMI 38.69 kg/m   Physical Exam  Constitutional: She appears well-developed and well-nourished.  HENT:  Head: Normocephalic and atraumatic.  Eyes: Conjunctivae are normal.  Neck: Normal range of motion.  Cardiovascular: Normal rate, regular rhythm, normal heart sounds and intact distal pulses.    Pulmonary/Chest: Effort normal and breath sounds normal. She has no wheezes.  Abdominal: Soft. Bowel sounds are normal. There is no hepatosplenomegaly. There is tenderness in the right lower quadrant. There is no rigidity, no rebound, no guarding, no tenderness at McBurney's point and negative Murphy's sign. No hernia.  Musculoskeletal: Normal range of motion.  Neurological: She is alert.  Skin: Skin is warm and dry.  Psychiatric: She has a normal mood and affect.  Nursing note and vitals reviewed.    ED Treatments / Results  Labs (all labs ordered are listed, but only abnormal results are displayed) Labs Reviewed  CBC WITH DIFFERENTIAL/PLATELET - Abnormal; Notable for the following components:      Result Value   Hemoglobin 11.2 (*)    MCV 75.5 (*)    MCH 22.7 (*)    RDW 16.4 (*)    All other components within normal limits  COMPREHENSIVE METABOLIC PANEL - Abnormal; Notable for the following components:   Glucose, Bld 111 (*)    Total Bilirubin 0.2 (*)    All other components within normal limits  URINALYSIS, ROUTINE W REFLEX MICROSCOPIC  PREGNANCY, URINE    EKG  EKG Interpretation None       Radiology Ct Abdomen Pelvis W Contrast  Result Date: 06/21/2017 CLINICAL DATA:  Lower abdominal pain for the past 4 days. EXAM: CT ABDOMEN AND PELVIS WITH CONTRAST TECHNIQUE: Multidetector CT imaging of the abdomen and pelvis was performed using the standard protocol following bolus administration of intravenous contrast. CONTRAST:  146mL ISOVUE-300 IOPAMIDOL (ISOVUE-300) INJECTION 61% COMPARISON:  None. FINDINGS: Lower chest: No acute abnormality. Hepatobiliary: No focal liver abnormality is seen. No gallstones, gallbladder wall thickening, or biliary dilatation. Pancreas: Unremarkable. No pancreatic ductal dilatation or surrounding inflammatory changes. Spleen: Normal in size without focal abnormality. Adrenals/Urinary Tract: Adrenal glands are unremarkable. Kidneys are normal,  without renal calculi, focal lesion, or hydronephrosis. Bladder is decompressed. Stomach/Bowel: Small to moderate hiatal hernia. The stomach is otherwise within normal limits. Appendix is normal. No bowel wall thickening, distention, or surrounding inflammatory changes. Vascular/Lymphatic: No significant vascular findings are present. No enlarged abdominal or pelvic lymph nodes. Reproductive: Fibroid uterus.  Nabothian cyst.  No adnexal mass. Other: No abdominal wall hernia or abnormality. No abdominopelvic ascites. No pneumoperitoneum. Musculoskeletal: No acute or significant osseous findings. Moderate degenerative disc disease at L5-S1. IMPRESSION: 1.  No acute intra-abdominal process.  Normal appendix. 2. Fibroid uterus. 3. Small to moderate hiatal hernia. Electronically Signed   By: Titus Dubin M.D.   On: 06/21/2017 20:09    Procedures Procedures (including critical care time)  Medications Ordered in ED Medications  traMADol (ULTRAM) tablet 50 mg (not administered)  iopamidol (ISOVUE-300) 61 % injection 100 mL (100 mLs Intravenous Contrast Given 06/21/17  1928)     Initial Impression / Assessment and Plan / ED Course  I have reviewed the triage vital signs and the nursing notes.  Pertinent labs & imaging results that were available during my care of the patient were reviewed by me and considered in my medical decision making (see chart for details).     RLQ pain of unclear etiology, chronic progressive pain. Appendix normal, no obstruction.  Review of chart including [place] per her gyn on 2/4 revealing for small uterine fibroids, left adnexal mass. Small right ovarian cyst. Plan f/u with Dr. Phineas Real if sx persist/worsen.  Tramadol prescribed.    [place] controlled substance database reviewed.   Final Clinical Impressions(s) / ED Diagnoses   Final diagnoses:  Right lower quadrant abdominal pain    ED Discharge Orders        Ordered    traMADol (ULTRAM) 50 MG tablet  Every 6 hours PRN      06/21/17 2039       Evalee Jefferson, PA-C 06/21/17 2046    Fredia Sorrow, MD 06/22/17 [phone]

## 2017-06-21 NOTE — Discharge Instructions (Signed)
As discussed your labs and CT imaging tonight do not pinpoint the source of your pain but we cannot find a surgical emergency (no appendicitis) or other serious problem.  You may take the tramadol if needed for pain relief in addition to your home ibuprofen.  A heating pad applied to your area of pain could be helpful, especially if your symptoms are from cramping or spasm.  Call Dr Phineas Real for further evaluation as discussed.

## 2017-06-21 NOTE — ED Triage Notes (Signed)
Pt c/o lower abdominal pain x 4 days. Denies n/v/d. Currently on her menstrual cycle.

## 2017-06-21 NOTE — Telephone Encounter (Signed)
Pt called c/o right side pain sharp pain off/on had [facility] on 05/23/17, states she had spotting x 4 days then 1 week cycle heavy,bleeding now down to spotting, no urinary symptoms, having normal bowel movements,  Pt said pain rates at level 7 at times, takes OTC Advil which helps for a couple of hours,but pain comes back again. told to come back for ultrasound in 3-4 months. Please advise

## 2017-10-10 ENCOUNTER — Encounter: Payer: Self-pay | Admitting: Emergency Medicine

## 2017-10-10 ENCOUNTER — Emergency Department
Admission: EM | Admit: 2017-10-10 | Discharge: 2017-10-10 | Disposition: A | Discharge status: Home / Self Care | Payer: 59 | Source: Home / Self Care | Attending: Family Medicine | Admitting: Family Medicine

## 2017-10-10 ENCOUNTER — Other Ambulatory Visit: Payer: Self-pay

## 2017-10-10 DIAGNOSIS — S90562A Insect bite (nonvenomous), left ankle, initial encounter: Secondary | ICD-10-CM

## 2017-10-10 DIAGNOSIS — W57XXXA Bitten or stung by nonvenomous insect and other nonvenomous arthropods, initial encounter: Secondary | ICD-10-CM | POA: Diagnosis not present

## 2017-10-10 MED ORDER — TRIAMCINOLONE ACETONIDE 0.1 % EX CREA: 1.0000 "application " | TOPICAL_CREAM | Freq: Two times a day (BID) | CUTANEOUS | 0 refills | Status: DC

## 2017-10-10 MED ORDER — PREDNISONE 20 MG PO TABS: 40.0000 mg | ORAL_TABLET | Freq: Every day | ORAL | 0 refills | Status: AC

## 2017-10-10 MED ORDER — CEPHALEXIN 500 MG PO CAPS: 500.0000 mg | ORAL_CAPSULE | Freq: Three times a day (TID) | ORAL | 0 refills | Status: DC

## 2017-10-10 NOTE — Discharge Instructions (Signed)
°  Keep area clean with warm water and soap. Try to keep Left leg elevated above your heart 2-3 times daily for at least 10-15 minutes to help with swelling and pain.  Please take antibiotics as prescribed and be sure to complete entire course even if you start to feel better to ensure infection does not come back.  Please follow up with family medicine or return to urgent care if needed if not improving in 3-4 days, sooner if significantly worsening.

## 2017-10-10 NOTE — ED Triage Notes (Signed)
Reports insect bite on left ankle 3-4 weeks ago which resolved; yesterday bitten by mosquito and now ankle is reddened, edematous and itches and hurts despite OTC.

## 2017-10-10 NOTE — ED Provider Notes (Signed)
[name]    CSN: [id number] Arrival date & time: 10/10/17  1706     History   Chief Complaint Chief Complaint  Patient presents with  . Insect Bite    HPI Elizabeth Farley is a 57 y.o. female.   HPI  Elizabeth Farley is a 57 y.o. female presenting to UC with c/o Left ankle swelling, redness, and itching that started yesterday.  She initially had an insect bite 3-4 weeks ago with redness and itching but it went away with OTC cortisone cream.  This time, she saw a mosquito bite the same area but now it is aching and sore along with itching, which woke her up last night despite using the cortisone cream.  Denies fever, chills, n/v/d.  Pt concerned she may be developing cellulitis but denies hx of cellulitis in the past.    Past Medical History:  Diagnosis Date  . Thyroid disease    hypothyroid    Patient Active Problem List   Diagnosis Date Noted  . Hypothyroid 03/15/2012    Past Surgical History:  Procedure Laterality Date  . DILATATION & CURETTAGE/HYSTEROSCOPY WITH MYOSURE N/A 08/18/2014   Procedure: DILATATION & CURETTAGE/HYSTEROSCOPY WITH MYOSURE;  Surgeon: Elizabeth Auerbach, MD;  Location: [facility];  Service: Gynecology;  Laterality: N/A;  . NOSE SURGERY    . PELVIC LAPAROSCOPY     x3 due to ectopic pregnancis   . TUBAL LIGATION      OB History    Gravida  5   Para  0   Term  0   Preterm  0   AB  5   Living  0     SAB  3   TAB  0   Ectopic  2   Multiple      Live Births               Home Medications    Prior to Admission medications   Medication Sig Start Date End Date Taking? Authorizing Provider  cephALEXin (KEFLEX) 500 MG capsule Take 1 capsule (500 mg total) by mouth 3 (three) times daily. 10/10/17   Noe Gens, PA-C  famotidine (PEPCID) 10 MG tablet Take 10 mg by mouth daily as needed for heartburn or indigestion.    [provider]  ibuprofen (ADVIL,MOTRIN) 200 MG tablet Take 400 mg by mouth every 6 (six)  hours as needed for mild pain.    [provider]  medroxyPROGESTERone (PROVERA) 10 MG tablet Take 1 tablet (10 mg total) by mouth daily. Patient not taking: Reported on 05/14/2017 03/30/17   Fontaine, Belinda Block, MD  megestrol (MEGACE) 40 MG tablet Take 1 tablet (40 mg total) by mouth daily. 05/23/17   Fontaine, Belinda Block, MD  predniSONE (DELTASONE) 20 MG tablet Take 2 tablets (40 mg total) by mouth daily with breakfast for 3 days. 10/10/17 10/13/17  Noe Gens, PA-C  traMADol (ULTRAM) 50 MG tablet Take 1 tablet (50 mg total) by mouth every 6 (six) hours as needed. 06/21/17   Evalee Jefferson, PA-C  triamcinolone cream (KENALOG) 0.1 % Apply 1 application topically 2 (two) times daily. 10/10/17   Noe Gens, PA-C    Family History Family History  Problem Relation Age of Onset  . Atrial fibrillation Mother   . Heart disease Father   . Diabetes Father     Social History Social History   Tobacco Use  . Smoking status: Never Smoker  . Smokeless tobacco: Never Used  Substance Use Topics  . Alcohol use: No    Alcohol/week: 0.0 oz  . Drug use: No     Allergies   Patient has no known allergies.   Review of Systems Review of Systems  Musculoskeletal: Positive for arthralgias, joint swelling and myalgias.  Skin: Positive for color change. Negative for wound.     Physical Exam Triage Vital Signs ED Triage Vitals  Enc Vitals Group     BP 10/10/17 1730 131/83     Pulse Rate 10/10/17 1730 70     Resp 10/10/17 1730 18     Temp 10/10/17 1730 98.3 F (36.8 C)     Temp Source 10/10/17 1730 Oral     SpO2 10/10/17 1730 98 %     Weight 10/10/17 1731 245 lb (111.1 kg)     Height 10/10/17 1731 5\' 8"  (1.727 m)     Head Circumference --      Peak Flow --      Pain Score 10/10/17 1731 3     Pain Loc --      Pain Edu? --      Excl. in [place]? --    No data found.  Updated Vital Signs BP 131/83 (BP Location: Right Arm)   Pulse 70   Temp 98.3 F (36.8 C) (Oral)   Resp 18   Ht  5\' 8"  (1.727 m)   Wt 245 lb (111.1 kg)   LMP 07/10/2017 (Approximate) Comment: perimenopausal  SpO2 98%   BMI 37.25 kg/m   Visual Acuity Right Eye Distance:   Left Eye Distance:   Bilateral Distance:    Right Eye Near:   Left Eye Near:    Bilateral Near:     Physical Exam  Constitutional: She is oriented to person, place, and time. She appears well-developed and well-nourished.  HENT:  Head: Normocephalic and atraumatic.  Eyes: EOM are normal.  Neck: Normal range of motion.  Cardiovascular: Normal rate.  Pulmonary/Chest: Effort normal.  Musculoskeletal: Normal range of motion. She exhibits edema and tenderness.  Left ankle: mild edema compared to the Right. Tender. Full ROM  Neurological: She is alert and oriented to person, place, and time.  Skin: Skin is warm and dry.  Left ankle: medial aspect just superior to malleolus: 3x4cm area of erythema, edema and mild tenderness. No bleeding or discharge. No fluctuance.   Psychiatric: She has a normal mood and affect. Her behavior is normal.  Nursing note and vitals reviewed.    UC Treatments / Results  Labs (all labs ordered are listed, but only abnormal results are displayed) Labs Reviewed - No data to display  EKG None  Radiology No results found.  Procedures Procedures (including critical care time)  Medications Ordered in UC Medications - No data to display  Initial Impression / Assessment and Plan / UC Course  I have reviewed the triage vital signs and the nursing notes.  Pertinent labs & imaging results that were available during my care of the patient were reviewed by me and considered in my medical decision making (see chart for details).     Hx and exam c/w infected insect bite Home care instructions provided.   Final Clinical Impressions(s) / UC Diagnoses   Final diagnoses:  Bug bite with infection, initial encounter     Discharge Instructions      Keep area clean with warm water and soap.  Try to keep Left leg elevated above your heart 2-3 times daily for at least 10-15 minutes  to help with swelling and pain.  Please take antibiotics as prescribed and be sure to complete entire course even if you start to feel better to ensure infection does not come back.  Please follow up with family medicine or return to urgent care if needed if not improving in 3-4 days, sooner if significantly worsening.    ED Prescriptions    Medication Sig Dispense Auth. Provider   cephALEXin (KEFLEX) 500 MG capsule Take 1 capsule (500 mg total) by mouth 3 (three) times daily. 21 capsule Leeroy Cha O, PA-C   triamcinolone cream (KENALOG) 0.1 % Apply 1 application topically 2 (two) times daily. 30 g Gerarda Fraction, Laith Antonelli O, PA-C   predniSONE (DELTASONE) 20 MG tablet Take 2 tablets (40 mg total) by mouth daily with breakfast for 3 days. 6 tablet Noe Gens, PA-C     Controlled Substance Prescriptions [place] Controlled Substance Registry consulted? Not Applicable   Tyrell Antonio 10/10/17 1740

## 2017-10-29 ENCOUNTER — Encounter: Payer: Self-pay | Admitting: Family Medicine

## 2017-10-29 ENCOUNTER — Ambulatory Visit: Payer: 59 | Admitting: Family Medicine

## 2017-10-29 ENCOUNTER — Other Ambulatory Visit: Payer: Self-pay

## 2017-10-29 VITALS — BP 130/88 | HR 67 | Temp 98.2°F | Ht 68.0 in | Wt 249.6 lb

## 2017-10-29 DIAGNOSIS — E038 Other specified hypothyroidism: Secondary | ICD-10-CM | POA: Insufficient documentation

## 2017-10-29 DIAGNOSIS — E039 Hypothyroidism, unspecified: Secondary | ICD-10-CM | POA: Diagnosis not present

## 2017-10-29 DIAGNOSIS — Z8759 Personal history of other complications of pregnancy, childbirth and the puerperium: Secondary | ICD-10-CM | POA: Insufficient documentation

## 2017-10-29 DIAGNOSIS — Z23 Encounter for immunization: Secondary | ICD-10-CM | POA: Diagnosis not present

## 2017-10-29 DIAGNOSIS — Z1212 Encounter for screening for malignant neoplasm of rectum: Secondary | ICD-10-CM | POA: Diagnosis not present

## 2017-10-29 DIAGNOSIS — Z0000 Encounter for general adult medical examination without abnormal findings: Secondary | ICD-10-CM | POA: Diagnosis not present

## 2017-10-29 DIAGNOSIS — Z1211 Encounter for screening for malignant neoplasm of colon: Secondary | ICD-10-CM

## 2017-10-29 DIAGNOSIS — Z1239 Encounter for other screening for malignant neoplasm of breast: Secondary | ICD-10-CM

## 2017-10-29 DIAGNOSIS — Z1231 Encounter for screening mammogram for malignant neoplasm of breast: Secondary | ICD-10-CM | POA: Diagnosis not present

## 2017-10-29 HISTORY — DX: Hypothyroidism, unspecified: E03.9

## 2017-10-29 LAB — CBC WITH DIFFERENTIAL/PLATELET
Basophils Absolute: 0 10*3/uL (ref 0.0–0.1)
Basophils Relative: 0.6 % (ref 0.0–3.0)
EOS ABS: 0.2 10*3/uL (ref 0.0–0.7)
EOS PCT: 2.6 % (ref 0.0–5.0)
HCT: 37.6 % (ref 36.0–46.0)
HEMOGLOBIN: 11.8 g/dL — AB (ref 12.0–15.0)
LYMPHS ABS: 2.3 10*3/uL (ref 0.7–4.0)
Lymphocytes Relative: 29.5 % (ref 12.0–46.0)
MCHC: 31.5 g/dL (ref 30.0–36.0)
MCV: 72.2 fl — ABNORMAL LOW (ref 78.0–100.0)
MONO ABS: 0.5 10*3/uL (ref 0.1–1.0)
Monocytes Relative: 6.5 % (ref 3.0–12.0)
NEUTROS PCT: 60.8 % (ref 43.0–77.0)
Neutro Abs: 4.7 10*3/uL (ref 1.4–7.7)
PLATELETS: 348 10*3/uL (ref 150.0–400.0)
RBC: 5.21 Mil/uL — AB (ref 3.87–5.11)
RDW: 19.3 % — ABNORMAL HIGH (ref 11.5–15.5)
WBC: 7.8 10*3/uL (ref 4.0–10.5)

## 2017-10-29 LAB — LIPID PANEL
CHOL/HDL RATIO: 4
Cholesterol: 188 mg/dL (ref 0–200)
HDL: 44.1 mg/dL (ref >39.00)
LDL Cholesterol: 111 mg/dL — ABNORMAL HIGH (ref 0–99)
NONHDL: 143.52
Triglycerides: 165 mg/dL — ABNORMAL HIGH (ref 0.0–149.0)
VLDL: 33 mg/dL (ref 0.0–40.0)

## 2017-10-29 LAB — COMPREHENSIVE METABOLIC PANEL
ALK PHOS: 69 U/L (ref 39–117)
ALT: 22 U/L (ref 0–35)
AST: 21 U/L (ref 0–37)
Albumin: 4 g/dL (ref 3.5–5.2)
BILIRUBIN TOTAL: 0.5 mg/dL (ref 0.2–1.2)
BUN: 8 mg/dL (ref 6–23)
CO2: 26 meq/L (ref 19–32)
CREATININE: 0.71 mg/dL (ref 0.40–1.20)
Calcium: 9.3 mg/dL (ref 8.4–10.5)
Chloride: 101 mEq/L (ref 96–112)
GFR: 90.76 mL/min (ref >60.00)
GLUCOSE: 93 mg/dL (ref 70–99)
Potassium: 4.3 mEq/L (ref 3.5–5.1)
Sodium: 136 mEq/L (ref 135–145)
TOTAL PROTEIN: 6.9 g/dL (ref 6.0–8.3)

## 2017-10-29 LAB — TSH: TSH: 5.19 u[IU]/mL — AB (ref 0.35–4.50)

## 2017-10-29 NOTE — Patient Instructions (Addendum)
Please return in 12 months for your annual complete physical; please come fasting. I will release your lab results to you on your MyChart account with further instructions. Please reply with any questions.   We will call you with information regarding your referral appointment. [facility] for colonoscopy; and [facility] for mammogram.  If you do not hear from [place] within the next 2 weeks, please let me know. It can take 1-2 weeks to get appointments set up with the specialists.   It was a pleasure meeting you today! Thank you for choosing [place] to meet your healthcare needs! I truly look forward to working with you. If you have any questions or concerns, please send me a message via Mychart or call the office at [phone].  Please do these things to maintain good health!   Exercise at least 30-45 minutes a day,  4-5 days a week.   Eat a low-fat diet with lots of fruits and vegetables, up to 7-9 servings per day.  Drink plenty of water daily. Try to drink 8 8oz glasses per day.  Seatbelts can save your life. Always wear your seatbelt.  Place Smoke Detectors on every level of your home and check batteries every year.  Schedule an appointment with an eye doctor for an eye exam every 1-2 years  Safe sex - use condoms to protect yourself from STDs if you could be exposed to these types of infections. Use birth control if you do not want to become pregnant and are sexually active.  Avoid heavy alcohol use. If you drink, keep it to less than 2 drinks/day and not every day.  [place].  Choose someone you trust that could speak for you if you became unable to speak for yourself.  Depression is common in our stressful world.If you're feeling down or losing interest in things you normally enjoy, please come in for a visit.  If anyone is threatening or hurting you, please get help. Physical or Emotional Violence is never OK.

## 2017-10-29 NOTE — Progress Notes (Signed)
Subjective  Chief Complaint  Patient presents with  . Establish Care    has been seeing Dr. Phineas Real, no complaints, Last physical at [facility] was in August of 2018, need colonoscopy   . Annual Exam    patient is fasting     HPI: Elizabeth Farley is a 57 y.o. female who presents to [place] at [facility] today for a Female Wellness Visit. Here to establish care. [employer] Employee. Reviewed medical records: was seeing GYN for irregular perimenopausal bleeding - resolved after bx and hormone treatment:last menses march 2019..   Wellness Visit: annual visit with health maintenance review and exam without Pap   57yo widowed female in casual relationship, G5 P0050 (2 ectopic, 3 SAB) here for cpe. Due mammogram [facility] and CRC screen. No Fh of colon cancer but brother recently with 8-9 benign polyps on exam.   No concerns. H/o hypothyroidism but no longer needing supplements.   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   Assessment  1. Annual physical exam   2. Acquired hypothyroidism   3. History of ectopic pregnancy   4. Breast cancer screening      Plan  Female Wellness Visit:  Age appropriate Health Maintenance and Prevention measures were discussed with patient. Included topics are cancer screening recommendations, ways to keep healthy (see AVS) including dietary and exercise recommendations, regular eye and dental care, use of seat belts, and avoidance of moderate alcohol use and tobacco use. mammo and colonoscopy referrals placed.   BMI: discussed patient's BMI and encouraged positive lifestyle modifications to help get to or maintain a target BMI.  HM needs and immunizations were addressed and ordered. See below for orders. See HM and immunization section for updates. Updated tdap today.  Routine labs and screening tests ordered including cmp, cbc and lipids where appropriate.  Discussed recommendations regarding Vit D and calcium  supplementation (see AVS)  Follow up: Return in about 1 year (around 10/30/2018) for complete physical.   Orders Placed This Encounter  Procedures  . MM Digital Screening  . CBC with Differential/Platelet  . Comprehensive metabolic panel  . Lipid panel  . HIV antibody  . TSH   No orders of the defined types were placed in this encounter.    Lifestyle: Body mass index is 37.95 kg/m. Wt Readings from Last 3 Encounters:  10/29/17 249 lb 9.6 oz (113.2 kg)  10/10/17 245 lb (111.1 kg)  06/21/17 247 lb (112 kg)   Diet: general Exercise: rarely,   Patient Active Problem List   Diagnosis Date Noted  . Acquired hypothyroidism 10/29/2017  . History of ectopic pregnancy 10/29/2017   Health Maintenance  Topic Date Due  . [name]  08/09/1981  . COLONOSCOPY  08/09/2012  . MAMMOGRAM  07/10/2017  . INFLUENZA VACCINE  11/22/2017  . PAP SMEAR  05/14/2022  . Hepatitis C Screening  Discontinued  . HIV Screening  Discontinued    There is no immunization history on file for this patient. We updated and reviewed the patient's past history in detail and it is documented below. Allergies: Patient has No Known Allergies. Past Medical History Patient  has a past medical history of Acquired hypothyroidism (10/29/2017), Ectopic pregnancy, and Thyroid disease. Past Surgical History Patient  has a past surgical history that includes Nose surgery; Tubal ligation; Pelvic laparoscopy; Dilatation & curettage/hysteroscopy with myosure (N/A, 08/18/2014); and Rhinoplasty. Family History: Patient family history includes Atrial fibrillation in her mother; Cancer in her paternal grandfather;  Diabetes in her father and mother; Heart disease in her father; Hyperlipidemia in her father and mother. Social History:  Patient  reports that she has never smoked. She has never used smokeless tobacco. She reports that she does not drink alcohol or use drugs.  Review of Systems: Constitutional: negative for  fever or malaise Ophthalmic: negative for photophobia, double vision or loss of vision Cardiovascular: negative for chest pain, dyspnea on exertion, or new LE swelling Respiratory: negative for SOB or persistent cough Gastrointestinal: negative for abdominal pain, change in bowel habits or melena Genitourinary: negative for dysuria or gross hematuria, no abnormal uterine bleeding or disharge Musculoskeletal: negative for new gait disturbance or muscular weakness Integumentary: negative for new or persistent rashes, no breast lumps Neurological: negative for TIA or stroke symptoms Psychiatric: negative for SI or delusions Allergic/Immunologic: negative for hives  Patient Care Team    Relationship Specialty Notifications Start End  Leamon Arnt, MD PCP - General Family Medicine  10/29/17   Fontaine, Belinda Block, MD Consulting Physician Gynecology  10/29/17     Objective  Vitals: BP 130/88   Pulse 67   Temp 98.2 F (36.8 C)   Ht 5\' 8"  (1.727 m)   Wt 249 lb 9.6 oz (113.2 kg)   LMP 06/22/2017 (LMP Unknown)   SpO2 98%   BMI 37.95 kg/m  General:  Well developed, well nourished, no acute distress  Psych:  Alert and orientedx3,normal mood and affect HEENT:  Normocephalic, atraumatic, non-icteric sclera, PERRL, oropharynx is clear without mass or exudate, supple neck without adenopathy, mass or thyromegaly Cardiovascular:  Normal S1, S2, RRR without gallop, rub or murmur, nondisplaced PMI Respiratory:  Good breath sounds bilaterally, CTAB with normal respiratory effort Gastrointestinal: normal bowel sounds, soft, non-tender, no noted masses. No HSM MSK: no deformities, contusions. Joints are without erythema or swelling. Spine and CVA region are nontender Skin:  Warm, no rashes or suspicious lesions noted, multiple benign appearing moles on back.  Neurologic:    Mental status is normal. CN 2-11 are normal. Gross motor and sensory exams are normal. Normal gait. No tremor   Commons side  effects, risks, benefits, and alternatives for medications and treatment plan prescribed today were discussed, and the patient expressed understanding of the given instructions. Patient is instructed to call or message via MyChart if he/she has any questions or concerns regarding our treatment plan. No barriers to understanding were identified. We discussed Red Flag symptoms and signs in detail. Patient expressed understanding regarding what to do in case of urgent or emergency type symptoms.   Medication list was reconciled, printed and provided to the patient in AVS. Patient instructions and summary information was reviewed with the patient as documented in the AVS. This note was prepared with assistance of Dragon voice recognition software. Occasional wrong-word or sound-a-like substitutions may have occurred due to the inherent limitations of voice recognition software

## 2017-10-30 ENCOUNTER — Encounter: Payer: Self-pay | Admitting: Gastroenterology

## 2017-10-30 LAB — HIV ANTIBODY (ROUTINE TESTING W REFLEX): HIV: NONREACTIVE

## 2017-10-31 ENCOUNTER — Encounter: Payer: Self-pay | Admitting: Family Medicine

## 2017-10-31 ENCOUNTER — Other Ambulatory Visit: Payer: Self-pay | Admitting: Emergency Medicine

## 2017-10-31 DIAGNOSIS — Z1239 Encounter for other screening for malignant neoplasm of breast: Secondary | ICD-10-CM

## 2017-10-31 DIAGNOSIS — D509 Iron deficiency anemia, unspecified: Secondary | ICD-10-CM | POA: Insufficient documentation

## 2017-10-31 MED ORDER — FERROUS SULFATE 325 (65 FE) MG PO TABS: 325.0000 mg | ORAL_TABLET | Freq: Every day | ORAL | 0 refills | Status: DC

## 2018-01-03 ENCOUNTER — Encounter: Payer: 59 | Admitting: Gastroenterology

## 2018-02-04 DIAGNOSIS — D1801 Hemangioma of skin and subcutaneous tissue: Secondary | ICD-10-CM | POA: Diagnosis not present

## 2018-02-04 DIAGNOSIS — L821 Other seborrheic keratosis: Secondary | ICD-10-CM | POA: Diagnosis not present

## 2018-02-14 DIAGNOSIS — L820 Inflamed seborrheic keratosis: Secondary | ICD-10-CM | POA: Diagnosis not present

## 2019-01-21 ENCOUNTER — Encounter: Payer: Self-pay | Admitting: Gynecology

## 2019-01-23 DIAGNOSIS — H5203 Hypermetropia, bilateral: Secondary | ICD-10-CM | POA: Diagnosis not present

## 2019-01-23 DIAGNOSIS — H2513 Age-related nuclear cataract, bilateral: Secondary | ICD-10-CM | POA: Diagnosis not present

## 2019-01-23 DIAGNOSIS — H524 Presbyopia: Secondary | ICD-10-CM | POA: Diagnosis not present

## 2019-01-23 DIAGNOSIS — H52223 Regular astigmatism, bilateral: Secondary | ICD-10-CM | POA: Diagnosis not present

## 2019-03-24 DIAGNOSIS — H16002 Unspecified corneal ulcer, left eye: Secondary | ICD-10-CM | POA: Diagnosis not present

## 2019-03-25 DIAGNOSIS — H16002 Unspecified corneal ulcer, left eye: Secondary | ICD-10-CM | POA: Diagnosis not present

## 2019-03-26 DIAGNOSIS — H16002 Unspecified corneal ulcer, left eye: Secondary | ICD-10-CM | POA: Diagnosis not present

## 2019-04-01 DIAGNOSIS — H16002 Unspecified corneal ulcer, left eye: Secondary | ICD-10-CM | POA: Diagnosis not present

## 2019-04-23 DIAGNOSIS — H168 Other keratitis: Secondary | ICD-10-CM | POA: Diagnosis not present

## 2019-05-01 DIAGNOSIS — H168 Other keratitis: Secondary | ICD-10-CM | POA: Diagnosis not present

## 2019-06-25 DIAGNOSIS — Z1231 Encounter for screening mammogram for malignant neoplasm of breast: Secondary | ICD-10-CM | POA: Diagnosis not present

## 2019-06-25 LAB — HM MAMMOGRAPHY

## 2019-06-26 ENCOUNTER — Encounter: Payer: Self-pay | Admitting: Family Medicine

## 2019-10-13 DIAGNOSIS — H168 Other keratitis: Secondary | ICD-10-CM | POA: Diagnosis not present

## 2020-06-30 DIAGNOSIS — Z1231 Encounter for screening mammogram for malignant neoplasm of breast: Secondary | ICD-10-CM | POA: Diagnosis not present

## 2020-06-30 LAB — HM MAMMOGRAPHY

## 2020-07-02 ENCOUNTER — Encounter: Payer: Self-pay | Admitting: Family Medicine

## 2020-09-06 ENCOUNTER — Encounter: Payer: Self-pay | Admitting: Family Medicine

## 2020-09-06 ENCOUNTER — Ambulatory Visit: Payer: 59 | Admitting: Family Medicine

## 2020-09-06 ENCOUNTER — Other Ambulatory Visit: Payer: Self-pay

## 2020-09-06 VITALS — BP 130/84 | HR 83 | Temp 98.1°F | Ht 68.0 in | Wt 274.0 lb

## 2020-09-06 DIAGNOSIS — N950 Postmenopausal bleeding: Secondary | ICD-10-CM | POA: Diagnosis not present

## 2020-09-06 DIAGNOSIS — Z0000 Encounter for general adult medical examination without abnormal findings: Secondary | ICD-10-CM | POA: Diagnosis not present

## 2020-09-06 DIAGNOSIS — Z1211 Encounter for screening for malignant neoplasm of colon: Secondary | ICD-10-CM

## 2020-09-06 DIAGNOSIS — Z1212 Encounter for screening for malignant neoplasm of rectum: Secondary | ICD-10-CM | POA: Diagnosis not present

## 2020-09-06 DIAGNOSIS — R252 Cramp and spasm: Secondary | ICD-10-CM | POA: Diagnosis not present

## 2020-09-06 LAB — CBC WITH DIFFERENTIAL/PLATELET
Basophils Absolute: 0.1 10*3/uL (ref 0.0–0.1)
Basophils Relative: 0.7 % (ref 0.0–3.0)
Eosinophils Absolute: 0.2 10*3/uL (ref 0.0–0.7)
Eosinophils Relative: 2.6 % (ref 0.0–5.0)
HCT: 38.8 % (ref 36.0–46.0)
Hemoglobin: 12.8 g/dL (ref 12.0–15.0)
Lymphocytes Relative: 20 % (ref 12.0–46.0)
Lymphs Abs: 1.4 10*3/uL (ref 0.7–4.0)
MCHC: 33 g/dL (ref 30.0–36.0)
MCV: 80.6 fl (ref 78.0–100.0)
Monocytes Absolute: 0.4 10*3/uL (ref 0.1–1.0)
Monocytes Relative: 6.3 % (ref 3.0–12.0)
Neutro Abs: 5 10*3/uL (ref 1.4–7.7)
Neutrophils Relative %: 70.4 % (ref 43.0–77.0)
Platelets: 272 10*3/uL (ref 150.0–400.0)
RBC: 4.82 Mil/uL (ref 3.87–5.11)
RDW: 16 % — ABNORMAL HIGH (ref 11.5–15.5)
WBC: 7.2 10*3/uL (ref 4.0–10.5)

## 2020-09-06 LAB — LIPID PANEL
Cholesterol: 168 mg/dL (ref 0–200)
HDL: 41.7 mg/dL (ref >39.00)
LDL Cholesterol: 100 mg/dL — ABNORMAL HIGH (ref 0–99)
NonHDL: 126.48
Total CHOL/HDL Ratio: 4
Triglycerides: 134 mg/dL (ref 0.0–149.0)
VLDL: 26.8 mg/dL (ref 0.0–40.0)

## 2020-09-06 LAB — COMPREHENSIVE METABOLIC PANEL
ALT: 25 U/L (ref 0–35)
AST: 22 U/L (ref 0–37)
Albumin: 4.1 g/dL (ref 3.5–5.2)
Alkaline Phosphatase: 72 U/L (ref 39–117)
BUN: 13 mg/dL (ref 6–23)
CO2: 30 mEq/L (ref 19–32)
Calcium: 9.3 mg/dL (ref 8.4–10.5)
Chloride: 103 mEq/L (ref 96–112)
Creatinine, Ser: 0.77 mg/dL (ref 0.40–1.20)
GFR: 85.24 mL/min (ref >60.00)
Glucose, Bld: 100 mg/dL — ABNORMAL HIGH (ref 70–99)
Potassium: 4.1 mEq/L (ref 3.5–5.1)
Sodium: 140 mEq/L (ref 135–145)
Total Bilirubin: 0.4 mg/dL (ref 0.2–1.2)
Total Protein: 6.9 g/dL (ref 6.0–8.3)

## 2020-09-06 LAB — TSH: TSH: 5.35 u[IU]/mL — ABNORMAL HIGH (ref 0.35–4.50)

## 2020-09-06 LAB — FOLLICLE STIMULATING HORMONE: FSH: 43.6 m[IU]/mL

## 2020-09-06 LAB — HEMOGLOBIN A1C: Hgb A1c MFr Bld: 6.7 % — ABNORMAL HIGH (ref 4.6–6.5)

## 2020-09-06 NOTE — Progress Notes (Signed)
Subjective  Chief Complaint  Patient presents with  . Hypothyroidism  . Arthritis    Unsure if she is getting arthritis, is having pain in both hands. Left thumb cramps up, in right hand hurts to hold pen hurts across knuckle area.  . Annual Exam    Fasting    HPI: Elizabeth Farley is a 60 y.o. female who presents to [place] at [place] [facility] today for a Female Wellness Visit. She also has the concerns and/or needs as listed above in the chief complaint. These will be addressed in addition to the Health Maintenance Visit.   Wellness Visit: annual visit with health maintenance review and exam without Pap, patient was last here almost 3 years ago for complete physical.  I reviewed that note.   Health maintenance: Morbidly obese female here for physical.  Up-to-date on Pap smear and mammograms.  Continues to work.  Eligible for Shingrix vaccination.  Eye exam up-to-date and normal.  Overdue for colorectal cancer screening. Chronic disease f/u and/or acute problem visit: (deemed necessary to be done in addition to the wellness visit):  Morbid obesity: Tries eat well.  Limited exercise.  Works 2 jobs.  Postmenopausal vaginal bleeding: Reports she went for about a year without having any more menstrual cycles.  However, back in February she experienced some bleeding.  Denies pelvic pain or discharge.  No hot flashes or other menopausal symptoms.  No further bleeding episodes.  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  Noticing hand cramps especially in.  Assessment  1. Annual physical exam   2. Morbid obesity ([place])   3. Postmenopausal vaginal bleeding   4. Muscle cramps   5. Screening for colorectal cancer      Plan  Female Wellness Visit:  Age appropriate Health Maintenance and Prevention measures were discussed with patient. Included topics are cancer screening recommendations, ways to keep healthy (see AVS) including dietary and exercise recommendations,  regular eye and dental care, use of seat belts, and avoidance of moderate alcohol use and tobacco use.  Refer to GI for colonoscopy  BMI: discussed patient's BMI and encouraged positive lifestyle modifications to help get to or maintain a target BMI.  HM needs and immunizations were addressed and ordered. See below for orders. See HM and immunization section for updates.  Eligible for Shingrix  Routine labs and screening tests ordered including cmp, cbc and lipids where appropriate.  Discussed recommendations regarding Vit D and calcium supplementation (see AVS)  Chronic disease management visit and/or acute problem visit:  Postmenopausal vaginal bleeding: Educated.  High risk for endometrial hyperplasia or other due to obesity.  We will check transvaginal ultrasound and refer to GYN if endometrial stripe is thickened.  Check FSH.  Education given.  Muscle cramps in hands: Mostly due to overuse and mild arthritis.  Stretching and good hydration discussed.   Follow up: Return in about 1 year (around 09/06/2021) for complete physical.  Orders Placed This Encounter  Procedures  . US PELVIS TRANSVAGINAL NON-OB (TV ONLY)  . CBC with Differential/Platelet  . Comprehensive metabolic panel  . Lipid panel  . TSH  . Follicle stimulating hormone  . Iron, TIBC and Ferritin Panel  . Hemoglobin A1c  . Ambulatory referral to Gastroenterology   No orders of the defined types were placed in this encounter.     Body mass index is 41.66 kg/m. Wt Readings from Last 3 Encounters:  09/06/20 274 lb (124.3 kg)  10/29/17 249 lb  9.6 oz (113.2 kg)  10/10/17 245 lb (111.1 kg)     Patient Active Problem List   Diagnosis Date Noted  . Iron deficiency anemia 10/31/2017  . Subclinical hypothyroidism 10/29/2017  . History of ectopic pregnancy 10/29/2017   Health Maintenance  Topic Date Due  . COLONOSCOPY (Pts 45-52yrs Insurance coverage will need to be confirmed)  Never done  . COVID-19 Vaccine (3  - Booster for Moderna series) 10/18/2019  . INFLUENZA VACCINE  11/22/2020  . MAMMOGRAM  06/30/2021  . PAP SMEAR-Modifier  05/14/2022  . TETANUS/TDAP  10/30/2027  . HPV VACCINES  Aged Out  . Hepatitis C Screening  Discontinued  . HIV Screening  Discontinued   Immunization History  Administered Date(s) Administered  . Moderna Sars-Covid-2 Vaccination 04/22/2019, 05/20/2019  . Tdap 10/29/2017   We updated and reviewed the patient's past history in detail and it is documented below. Allergies: Patient has No Known Allergies. Past Medical History Patient  has a past medical history of Acquired hypothyroidism (10/29/2017), Ectopic pregnancy, and Thyroid disease. Past Surgical History Patient  has a past surgical history that includes Nose surgery; Tubal ligation; Pelvic laparoscopy; Dilatation & curettage/hysteroscopy with myosure (N/A, 08/18/2014); and Rhinoplasty. Family History: Patient family history includes Atrial fibrillation in her mother; Cancer in her paternal grandfather; Diabetes in her father and mother; Heart disease in her father; Hyperlipidemia in her father and mother. Social History:  Patient  reports that she has never smoked. She has never used smokeless tobacco. She reports that she does not drink alcohol and does not use drugs.  Review of Systems: Constitutional: negative for fever or malaise Ophthalmic: negative for photophobia, double vision or loss of vision Cardiovascular: negative for chest pain, dyspnea on exertion, or new LE swelling Respiratory: negative for SOB or persistent cough Gastrointestinal: negative for abdominal pain, change in bowel habits or melena Genitourinary: negative for dysuria or gross hematuria, no abnormal uterine bleeding or disharge Musculoskeletal: negative for new gait disturbance or muscular weakness Integumentary: negative for new or persistent rashes, no breast lumps Neurological: negative for TIA or stroke symptoms Psychiatric:  negative for SI or delusions Allergic/Immunologic: negative for hives  Patient Care Team    Relationship Specialty Notifications Start End  Leamon Arnt, MD PCP - General Family Medicine  10/29/17   Fontaine, Belinda Block, MD (Inactive) Consulting Physician Gynecology  10/29/17     Objective  Vitals: BP 130/84   Pulse 83   Temp 98.1 F (36.7 C) (Temporal)   Ht 5\' 8"  (1.727 m)   Wt 274 lb (124.3 kg)   LMP  (LMP Unknown) Comment: Last cycle in February.  SpO2 97%   BMI 41.66 kg/m  General:  Well developed, well nourished, no acute distress  Psych:  Alert and orientedx3,normal mood and affect HEENT:  Normocephalic, atraumatic, non-icteric sclera,  supple neck without adenopathy, mass or thyromegaly Cardiovascular:  Normal S1, S2, RRR without gallop, rub or murmur Respiratory:  Good breath sounds bilaterally, CTAB with normal respiratory effort Gastrointestinal: normal bowel sounds, soft, non-tender, no noted masses. No HSM MSK: no deformities, contusions. Joints are without erythema or swelling.  Skin:  Warm, no rashes or suspicious lesions noted Neurologic:    Mental status is normal. CN 2-11 are normal. Gross motor and sensory exams are normal. Normal gait. No tremor    Commons side effects, risks, benefits, and alternatives for medications and treatment plan prescribed today were discussed, and the patient expressed understanding of the given instructions. Patient is instructed to  call or message via MyChart if he/she has any questions or concerns regarding our treatment plan. No barriers to understanding were identified. We discussed Red Flag symptoms and signs in detail. Patient expressed understanding regarding what to do in case of urgent or emergency type symptoms.   Medication list was reconciled, printed and provided to the patient in AVS. Patient instructions and summary information was reviewed with the patient as documented in the AVS. This note was prepared with assistance  of Dragon voice recognition software. Occasional wrong-word or sound-a-like substitutions may have occurred due to the inherent limitations of voice recognition software  This visit occurred during the SARS-CoV-2 public health emergency.  Safety protocols were in place, including screening questions prior to the visit, additional usage of staff PPE, and extensive cleaning of exam room while observing appropriate contact time as indicated for disinfecting solutions.

## 2020-09-06 NOTE — Progress Notes (Signed)
Please add on Free T4, and T3, dx: abnormal TSh Thanks, Dr. Jonni Sanger '

## 2020-09-06 NOTE — Patient Instructions (Signed)
Please return in 12 months for your annual complete physical; please come fasting. Sooner if needed for problems.   I will release your lab results to you on your MyChart account with further instructions. Please reply with any questions.   We will call you with information regarding your referral appointment. GI for colonoscopy. If you do not hear from [place] within the next 2 weeks, please let me know. It can take 1-2 weeks to get appointments set up with the specialists.   Please call your insurance company to verify coverage for the shingrix vaccines. They should be covered. You then can schedule a nurse visit to get the 1st of 2 shots. The 2nd can be schedule 6 months after that.   If you have any questions or concerns, please don't hesitate to send me a message via MyChart or call the office at [phone]. Thank you for visiting with [place] today! It's our pleasure caring for you.   Fat and Cholesterol Restricted Eating Plan Getting too much fat and cholesterol in your diet may cause health problems. Choosing the right foods helps keep your fat and cholesterol at normal levels. This can keep you from getting certain diseases. Your doctor may recommend an eating plan that includes:  Total fat: ______% or less of total calories a day.  Saturated fat: ______% or less of total calories a day.  Cholesterol: less than _________mg a day.  Fiber: ______g a day. What are tips for following this plan? Meal planning  At meals, divide your plate into four equal parts: ? Fill one-half of your plate with vegetables and green salads. ? Fill one-fourth of your plate with whole grains. ? Fill one-fourth of your plate with low-fat (lean) protein foods.  Eat fish that is high in omega-3 fats at least two times a week. This includes mackerel, tuna, sardines, and salmon.  Eat foods that are high in fiber, such as whole grains, beans, apples, broccoli, carrots, peas, and barley. General tips  Work with  your doctor to lose weight if you need to.  Avoid: ? Foods with added sugar. ? Fried foods. ? Foods with partially hydrogenated oils.  Limit alcohol intake to no more than 1 drink a day for nonpregnant women and 2 drinks a day for men. One drink equals 12 oz of beer, 5 oz of wine, or 1 oz of hard liquor.   Reading food labels  Check food labels for: ? Trans fats. ? Partially hydrogenated oils. ? Saturated fat (g) in each serving. ? Cholesterol (mg) in each serving. ? Fiber (g) in each serving.  Choose foods with healthy fats, such as: ? Monounsaturated fats. ? Polyunsaturated fats. ? Omega-3 fats.  Choose grain products that have whole grains. Look for the word "whole" as the first word in the ingredient list. Cooking  Cook foods using low-fat methods. These include baking, boiling, grilling, and broiling.  Eat more home-cooked foods. Eat at restaurants and buffets less often.  Avoid cooking using saturated fats, such as butter, cream, palm oil, palm kernel oil, and coconut oil. Recommended foods Fruits  All fresh, canned (in natural juice), or frozen fruits. Vegetables  Fresh or frozen vegetables (raw, steamed, roasted, or grilled). Green salads. Grains  Whole grains, such as whole wheat or whole grain breads, crackers, cereals, and pasta. Unsweetened oatmeal, bulgur, barley, quinoa, or brown rice. Corn or whole wheat flour tortillas. Meats and other protein foods  Ground beef (85% or leaner), grass-fed beef, or beef trimmed of  fat. Skinless chicken or [place]. Ground chicken or [place]. Pork trimmed of fat. All fish and seafood. Egg whites. Dried beans, peas, or lentils. Unsalted nuts or seeds. Unsalted canned beans. Nut butters without added sugar or oil. Dairy  Low-fat or nonfat dairy products, such as skim or 1% milk, 2% or reduced-fat cheeses, low-fat and fat-free ricotta or cottage cheese, or plain low-fat and nonfat yogurt. Fats and oils  Tub margarine without  trans fats. Light or reduced-fat mayonnaise and salad dressings. Avocado. Olive, canola, sesame, or safflower oils. The items listed above may not be a complete list of foods and beverages you can eat. Contact a dietitian for more information.   Foods to avoid Fruits  Canned fruit in heavy syrup. Fruit in cream or butter sauce. Fried fruit. Vegetables  Vegetables cooked in cheese, cream, or butter sauce. Fried vegetables. Grains  White bread. White pasta. White rice. Cornbread. Bagels, pastries, and croissants. Crackers and snack foods that contain trans fat and hydrogenated oils. Meats and other protein foods  Fatty cuts of meat. Ribs, chicken wings, bacon, sausage, bologna, salami, chitterlings, fatback, hot dogs, bratwurst, and packaged lunch meats. Liver and organ meats. Whole eggs and egg yolks. Chicken and [place] with skin. Fried meat. Dairy  Whole or 2% milk, cream, half-and-half, and cream cheese. Whole milk cheeses. Whole-fat or sweetened yogurt. Full-fat cheeses. Nondairy creamers and whipped toppings. Processed cheese, cheese spreads, and cheese curds. Beverages  Alcohol. Sugar-sweetened drinks such as sodas, lemonade, and fruit drinks. Fats and oils  Butter, stick margarine, lard, shortening, ghee, or bacon fat. Coconut, palm kernel, and palm oils. Sweets and desserts  Corn syrup, sugars, honey, and molasses. Candy. Jam and jelly. Syrup. Sweetened cereals. Cookies, pies, cakes, donuts, muffins, and ice cream. The items listed above may not be a complete list of foods and beverages you should avoid. Contact a dietitian for more information. Summary  Choosing the right foods helps keep your fat and cholesterol at normal levels. This can keep you from getting certain diseases.  At meals, fill one-half of your plate with vegetables and green salads.  Eat high-fiber foods, like whole grains, beans, apples, carrots, peas, and barley.  Limit added sugar, saturated fats,  alcohol, and fried foods. This information is not intended to replace advice given to you by your health care provider. Make sure you discuss any questions you have with your health care provider. Document Revised: 08/13/2019 Document Reviewed: 08/13/2019 Elsevier Patient Education  2021 Elsevier Inc.   Preventive Care 110-55 Years Old, Female Preventive care refers to lifestyle choices and visits with your health care provider that can promote health and wellness. This includes:  A yearly physical exam. This is also called an annual wellness visit.  Regular dental and eye exams.  Immunizations.  Screening for certain conditions.  Healthy lifestyle choices, such as: ? Eating a healthy diet. ? Getting regular exercise. ? Not using drugs or products that contain nicotine and tobacco. ? Limiting alcohol use. What can I expect for my preventive care visit? Physical exam Your health care provider will check your:  Height and weight. These may be used to calculate your BMI (body mass index). BMI is a measurement that tells if you are at a healthy weight.  Heart rate and blood pressure.  Body temperature.  Skin for abnormal spots. Counseling Your health care provider may ask you questions about your:  Past medical problems.  Family's medical history.  Alcohol, tobacco, and drug use.  Emotional well-being.  Home life and relationship well-being.  Sexual activity.  Diet, exercise, and sleep habits.  Work and work Statistician.  Access to firearms.  Method of birth control.  Menstrual cycle.  Pregnancy history. What immunizations do I need? Vaccines are usually given at various ages, according to a schedule. Your health care provider will recommend vaccines for you based on your age, medical history, and lifestyle or other factors, such as travel or where you work.   What tests do I need? Blood tests  Lipid and cholesterol levels. These may be checked every 5  years, or more often if you are over 22 years old.  Hepatitis C test.  Hepatitis B test. Screening  Lung cancer screening. You may have this screening every year starting at age 24 if you have a 30-pack-year history of smoking and currently smoke or have quit within the past 15 years.  Colorectal cancer screening. ? All adults should have this screening starting at age 57 and continuing until age 73. ? Your health care provider may recommend screening at age 65 if you are at increased risk. ? You will have tests every 1-10 years, depending on your results and the type of screening test.  Diabetes screening. ? This is done by checking your blood sugar (glucose) after you have not eaten for a while (fasting). ? You may have this done every 1-3 years.  Mammogram. ? This may be done every 1-2 years. ? Talk with your health care provider about when you should start having regular mammograms. This may depend on whether you have a family history of breast cancer.  BRCA-related cancer screening. This may be done if you have a family history of breast, ovarian, tubal, or peritoneal cancers.  Pelvic exam and Pap test. ? This may be done every 3 years starting at age 18. ? Starting at age 13, this may be done every 5 years if you have a Pap test in combination with an HPV test. Other tests  STD (sexually transmitted disease) testing, if you are at risk.  Bone density scan. This is done to screen for osteoporosis. You may have this scan if you are at high risk for osteoporosis. Talk with your health care provider about your test results, treatment options, and if necessary, the need for more tests. Follow these instructions at home: Eating and drinking  Eat a diet that includes fresh fruits and vegetables, whole grains, lean protein, and low-fat dairy products.  Take vitamin and mineral supplements as recommended by your health care provider.  Do not drink alcohol if: ? Your health care  provider tells you not to drink. ? You are pregnant, may be pregnant, or are planning to become pregnant.  If you drink alcohol: ? Limit how much you have to 0-1 drink a day. ? Be aware of how much alcohol is in your drink. In the [place] one drink equals one 12 oz bottle of beer (355 mL), one 5 oz glass of wine (148 mL), or one 1 oz glass of hard liquor (44 mL).   Lifestyle  Take daily care of your teeth and gums. Brush your teeth every morning and night with fluoride toothpaste. Floss one time each day.  Stay active. Exercise for at least 30 minutes 5 or more days each week.  Do not use any products that contain nicotine or tobacco, such as cigarettes, e-cigarettes, and chewing tobacco. If you need help quitting, ask your health care provider.  Do not use drugs.  If you are sexually active, practice safe sex. Use a condom or other form of protection to prevent STIs (sexually transmitted infections).  If you do not wish to become pregnant, use a form of birth control. If you plan to become pregnant, see your health care provider for a prepregnancy visit.  If told by your health care provider, take low-dose aspirin daily starting at age 16.  Find healthy ways to cope with stress, such as: ? Meditation, yoga, or listening to music. ? Journaling. ? Talking to a trusted person. ? Spending time with friends and family. Safety  Always wear your seat belt while driving or riding in a vehicle.  Do not drive: ? If you have been drinking alcohol. Do not ride with someone who has been drinking. ? When you are tired or distracted. ? While texting.  Wear a helmet and other protective equipment during sports activities.  If you have firearms in your house, make sure you follow all gun safety procedures. What's next?  Visit your health care provider once a year for an annual wellness visit.  Ask your health care provider how often you should have your eyes and teeth checked.  Stay up to  date on all vaccines. This information is not intended to replace advice given to you by your health care provider. Make sure you discuss any questions you have with your health care provider. Document Revised: 01/13/2020 Document Reviewed: 12/20/2017 Elsevier Patient Education  2021 [employer].

## 2020-09-07 ENCOUNTER — Other Ambulatory Visit: Payer: Self-pay | Admitting: *Deleted

## 2020-09-07 DIAGNOSIS — E119 Type 2 diabetes mellitus without complications: Secondary | ICD-10-CM

## 2020-09-07 LAB — IRON,TIBC AND FERRITIN PANEL
%SAT: 12 % (calc) — ABNORMAL LOW (ref 16–45)
Ferritin: 17 ng/mL (ref 16–232)
Iron: 45 ug/dL (ref 45–160)
TIBC: 366 mcg/dL (calc) (ref 250–450)

## 2020-09-27 ENCOUNTER — Ambulatory Visit
Admission: RE | Admit: 2020-09-27 | Discharge: 2020-09-27 | Disposition: A | Discharge status: Home / Self Care | Payer: 59 | Source: Ambulatory Visit | Attending: Family Medicine | Admitting: Family Medicine

## 2020-09-27 DIAGNOSIS — N950 Postmenopausal bleeding: Secondary | ICD-10-CM | POA: Diagnosis not present

## 2020-09-27 DIAGNOSIS — Z0000 Encounter for general adult medical examination without abnormal findings: Secondary | ICD-10-CM

## 2020-09-27 DIAGNOSIS — N888 Other specified noninflammatory disorders of cervix uteri: Secondary | ICD-10-CM | POA: Diagnosis not present

## 2020-09-28 ENCOUNTER — Encounter: Payer: 59 | Attending: Family Medicine | Admitting: Dietician

## 2020-09-28 ENCOUNTER — Other Ambulatory Visit: Payer: Self-pay

## 2020-09-28 ENCOUNTER — Encounter: Payer: Self-pay | Admitting: Dietician

## 2020-09-28 DIAGNOSIS — E119 Type 2 diabetes mellitus without complications: Secondary | ICD-10-CM

## 2020-09-28 NOTE — Progress Notes (Signed)
Patient was seen on 09/28/2020 for the first of a series of three diabetes self-management courses at [facility].  Patient Education Plan per assessed needs and concerns is to attend three course education program for Diabetes Self Management Education.  The following learning objectives were met by the patient during this class:  Describe diabetes, types of diabetes and pathophysiology  State some common risk factors for diabetes  Defines the role of glucose and insulin  Describe the relationship between diabetes and cardiovascular and other risks  State the members of the Healthcare Team  States the rationale for glucose monitoring and when to test  State their individual [place] the importance of logging glucose readings and how to interpret the readings  Identifies A1C target  Explain the correlation between A1c and eAG values  State symptoms and treatment of high blood glucose and low blood glucose  Explain proper technique for glucose testing and identify proper sharps disposal  Handouts given during class include:  How to Thrive:  A Guide for Your Journey with Diabetes by the ADA  Meal Plan Card and carbohydrate content list  Dietary intake form  Low Sodium Flavoring Tips  Types of Fats  Dining Out  Label reading  Snack list  Planning a balanced meal  The diabetes portion plate  Diabetes Resources  A1c to eAG Conversion Chart  Blood Glucose Log  Diabetes Recommended Care Schedule  Support Group  Diabetes Success Plan  Core Class Satisfaction Survey   Follow-Up Plan:  Attend core 2

## 2020-10-01 ENCOUNTER — Encounter: Payer: Self-pay | Admitting: Family Medicine

## 2020-10-05 ENCOUNTER — Other Ambulatory Visit: Payer: Self-pay

## 2020-10-05 ENCOUNTER — Encounter: Payer: Self-pay | Admitting: Dietician

## 2020-10-05 ENCOUNTER — Encounter: Payer: 59 | Admitting: Dietician

## 2020-10-05 DIAGNOSIS — E119 Type 2 diabetes mellitus without complications: Secondary | ICD-10-CM | POA: Diagnosis not present

## 2020-10-05 NOTE — Progress Notes (Signed)
Patient was seen on 10/05/2020 for the second of a series of three diabetes self-management courses at the [facility]. The following learning objectives were met by the patient during this class:  Describe the role of different macronutrients on glucose Explain how carbohydrates affect blood glucose State what foods contain the most carbohydrates Demonstrate carbohydrate counting Demonstrate how to read Nutrition Facts food label Describe effects of various fats on heart health Describe the importance of good nutrition for health and healthy eating strategies Describe techniques for managing your shopping, cooking and meal planning List strategies to follow meal plan when dining out Describe the effects of alcohol on glucose and how to use it safely  Goals:  Follow Diabetes Meal Plan as instructed  Aim to spread carbs evenly throughout the day  Aim for 3 meals per day and snacks as needed Include lean protein foods to meals/snacks  Monitor glucose levels as instructed by your doctor   Follow-Up Plan: Attend Core 3 Work towards following your personal food plan.

## 2020-10-12 ENCOUNTER — Other Ambulatory Visit: Payer: Self-pay

## 2020-10-12 ENCOUNTER — Encounter: Payer: 59 | Admitting: Dietician

## 2020-10-12 DIAGNOSIS — E119 Type 2 diabetes mellitus without complications: Secondary | ICD-10-CM

## 2020-10-12 NOTE — Progress Notes (Signed)
Patient was seen on 10/12/2020 for the third of a series of three diabetes self-management courses at the [facility].   State the amount of activity recommended for healthy living Describe activities suitable for individual needs Identify ways to regularly incorporate activity into daily life Identify barriers to activity and ways to over come these barriers Identify diabetes medications being personally used and their primary action for lowering glucose and possible side effects Describe role of stress on blood glucose and develop strategies to address psychosocial issues Identify diabetes complications and ways to prevent them Explain how to manage diabetes during illness Evaluate success in meeting personal goal Establish 2-3 goals that they will plan to diligently work on  Goals:  I will be active 30 minutes or more 5 times a week (walking) To help manage stress I will  de stress at least 5 times a week by sitting and just relaxing with nature.  Your patient has identified these potential barriers to change:  Motivation  Your patient has identified their diabetes self-care support plan as  [facility] Support Group     Plan:  Attend Support Group as desired

## 2020-12-15 ENCOUNTER — Encounter: Payer: Self-pay | Admitting: Gastroenterology

## 2021-01-21 ENCOUNTER — Ambulatory Visit: Payer: 59 | Admitting: Family Medicine

## 2021-01-24 ENCOUNTER — Encounter: Payer: Self-pay | Admitting: Physician Assistant

## 2021-01-24 ENCOUNTER — Telehealth (INDEPENDENT_AMBULATORY_CARE_PROVIDER_SITE_OTHER): Payer: 59 | Admitting: Physician Assistant

## 2021-01-24 VITALS — Temp 98.4°F | Ht 68.0 in

## 2021-01-24 DIAGNOSIS — R051 Acute cough: Secondary | ICD-10-CM

## 2021-01-24 DIAGNOSIS — J0100 Acute maxillary sinusitis, unspecified: Secondary | ICD-10-CM | POA: Diagnosis not present

## 2021-01-24 MED ORDER — HYDROCOD POLST-CPM POLST ER 10-8 MG/5ML PO SUER: 5.0000 mL | Freq: Every evening | ORAL | 0 refills | Status: DC | PRN

## 2021-01-24 MED ORDER — AMOXICILLIN-POT CLAVULANATE 875-125 MG PO TABS: 1.0000 | ORAL_TABLET | Freq: Two times a day (BID) | ORAL | 0 refills | Status: DC

## 2021-01-24 NOTE — Patient Instructions (Signed)
Please take the medications as directed and call for in-person appointment if worse or no improvement.

## 2021-01-24 NOTE — Progress Notes (Signed)
Virtual Visit via Video Note  I connected with  [place]  on 01/24/21 at 10:00 AM EDT by a video enabled telemedicine application and verified that I am speaking with the correct person using two identifiers.  Location: Patient: home Provider: Therapist, music at [place] present: Patient and myself   I discussed the limitations of evaluation and management by telemedicine and the availability of in person appointments. The patient expressed understanding and agreed to proceed.   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   COVID-19 PCR test on 01-21-21 was negative.     Observations/Objective:   Gen: Awake, alert, no acute distress, very congested sounding Resp: Breathing is even and non-labored Psych: calm/pleasant demeanor Neuro: Alert and Oriented x 3, + facial symmetry, speech is clear.   Assessment and Plan: 1. Acute non-recurrent maxillary sinusitis 2. Acute cough Persistent symptoms despite conservative efforts at home. Will Rx Augmentin at this time, take with food. Cautioned on antibiotic use and possible side effects.  Also sent prescription for Tussionex cough syrup to take at bedtime to help her rest.  Do not take while driving.  Advised nasal saline, humidifier, and pushing fluids as well. Call if worse or no improvement.     Follow Up Instructions:    I discussed the assessment and treatment plan with the patient. The patient was provided an opportunity to ask questions and all were answered. The patient agreed with the plan and demonstrated an  understanding of the instructions.   The patient was advised to call back or seek an in-person evaluation if the symptoms worsen or if the condition fails to improve as anticipated.  Natascha Edmonds M Davian Hanshaw, PA-C

## 2021-01-28 ENCOUNTER — Telehealth: Payer: Self-pay | Admitting: Physician Assistant

## 2021-01-31 ENCOUNTER — Telehealth: Payer: Self-pay

## 2021-01-31 MED ORDER — HYDROCOD POLST-CPM POLST ER 10-8 MG/5ML PO SUER: 5.0000 mL | Freq: Every evening | ORAL | 0 refills | Status: DC | PRN

## 2021-01-31 NOTE — Telephone Encounter (Signed)
error 

## 2021-01-31 NOTE — Telephone Encounter (Signed)
Pt called requesting

## 2021-01-31 NOTE — Telephone Encounter (Signed)
Pt called requesting a refill for chlorpheniramine-HYDROcodone (TUSSIONEX PENNKINETIC ER) 10-8 MG/5ML SURE. Nicolle was seen on 10/3 for sinus and congestion. Sahory stated that she is already out of the cough syrup because she takes it during the day and night. Can prescription be refilled? Please Advise.

## 2021-01-31 NOTE — Telephone Encounter (Signed)
I have refilled cough medication; however,pt is overdue for DM visit. Please get her scheduled. Thanks.

## 2021-02-01 NOTE — Telephone Encounter (Signed)
Pt is scheduled °

## 2021-02-09 ENCOUNTER — Encounter: Payer: Self-pay | Admitting: Family Medicine

## 2021-02-09 ENCOUNTER — Other Ambulatory Visit: Payer: Self-pay

## 2021-02-09 ENCOUNTER — Ambulatory Visit: Payer: 59 | Admitting: Family Medicine

## 2021-02-09 VITALS — BP 126/80 | HR 83 | Temp 98.2°F | Ht 68.0 in | Wt 260.0 lb

## 2021-02-09 DIAGNOSIS — E1169 Type 2 diabetes mellitus with other specified complication: Secondary | ICD-10-CM | POA: Diagnosis not present

## 2021-02-09 DIAGNOSIS — Z7185 Encounter for immunization safety counseling: Secondary | ICD-10-CM

## 2021-02-09 DIAGNOSIS — Z23 Encounter for immunization: Secondary | ICD-10-CM | POA: Diagnosis not present

## 2021-02-09 DIAGNOSIS — Z1212 Encounter for screening for malignant neoplasm of rectum: Secondary | ICD-10-CM

## 2021-02-09 DIAGNOSIS — E782 Mixed hyperlipidemia: Secondary | ICD-10-CM | POA: Diagnosis not present

## 2021-02-09 DIAGNOSIS — Z1211 Encounter for screening for malignant neoplasm of colon: Secondary | ICD-10-CM

## 2021-02-09 DIAGNOSIS — N950 Postmenopausal bleeding: Secondary | ICD-10-CM

## 2021-02-09 DIAGNOSIS — M79671 Pain in right foot: Secondary | ICD-10-CM | POA: Diagnosis not present

## 2021-02-09 DIAGNOSIS — E119 Type 2 diabetes mellitus without complications: Secondary | ICD-10-CM | POA: Diagnosis not present

## 2021-02-09 DIAGNOSIS — M79672 Pain in left foot: Secondary | ICD-10-CM

## 2021-02-09 LAB — MICROALBUMIN / CREATININE URINE RATIO
Creatinine,U: 78.1 mg/dL
Microalb Creat Ratio: 1.6 mg/g (ref 0.0–30.0)
Microalb, Ur: 1.3 mg/dL (ref 0.0–1.9)

## 2021-02-09 LAB — POCT GLYCOSYLATED HEMOGLOBIN (HGB A1C): Hemoglobin A1C: 5.9 % — AB (ref 4.0–5.6)

## 2021-02-09 MED ORDER — ROSUVASTATIN CALCIUM 5 MG PO TABS: 5.0000 mg | ORAL_TABLET | Freq: Every evening | ORAL | 3 refills | Status: DC

## 2021-02-09 NOTE — Patient Instructions (Signed)
Please return in 3 months for diabetes follow up   [place] work with weight loss and diet changes. Keep it up.  Please start the crestor to lower cholesterol at night.   We will call you to see a sports medicine specialist to help with your foot pain.   Today you were given your Prevnar 20 and 1st of 2 Shingrix vaccinations.    If you have any questions or concerns, please don't hesitate to send me a message via MyChart or call the office at [phone]. Thank you for visiting with Elizabeth Farley today! It's our pleasure caring for you.   Diabetes Mellitus and Standards of [place] with and managing diabetes (diabetes mellitus) can be complicated. Your diabetes treatment may be managed by a team of health care providers, including: A physician who specializes in diabetes (endocrinologist). You might also have visits with a nurse practitioner or physician assistant. Nurses. A registered dietitian. A certified diabetes care and education specialist. An exercise specialist. A pharmacist. An eye doctor. A foot specialist (podiatrist). A dental care provider. A primary care provider. A mental health care provider. How to manage your diabetes You can do many things to successfully manage your diabetes. Your health care providers will follow guidelines to help you get the best quality of care. Here are general guidelines for your diabetes management plan. Your health care providers may give you more specific instructions. Physical exams When you are diagnosed with diabetes, and each year after that, your health care provider will ask about your medical and family history. You will have a physical exam, which may include: Measuring your height, weight, and body mass index (BMI). Checking your blood pressure. This will be done at every routine medical visit. Your target blood pressure may vary depending on your medical conditions, your age, and other factors. A thyroid exam. A skin  exam. Screening for nerve damage (peripheral neuropathy). This may include checking the pulse in your legs and feet and the level of sensation in your hands and feet. A foot exam to inspect the structure and skin of your feet, including checking for cuts, bruises, redness, blisters, sores, or other problems. Screening for blood vessel (vascular) problems. This may include checking the pulse in your legs and feet and checking your temperature. Blood tests Depending on your treatment plan and your personal needs, you may have the following tests: Hemoglobin A1C (HbA1C). This test provides information about blood sugar (glucose) control over the previous 2-3 months. It is used to adjust your treatment plan, if needed. This test will be done: At least 2 times a year, if you are meeting your treatment goals. 4 times a year, if you are not meeting your treatment goals or if your goals have changed. Lipid testing, including total cholesterol, LDL and HDL cholesterol, and triglyceride levels. The goal for LDL is less than 100 mg/dL (5.5 mmol/L). If you are at high risk for complications, the goal is less than 70 mg/dL (3.9 mmol/L). The goal for HDL is 40 mg/dL (2.2 mmol/L) or higher for men, and 50 mg/dL (2.8 mmol/L) or higher for women. An HDL cholesterol of 60 mg/dL (3.3 mmol/L) or higher gives some protection against heart disease. The goal for triglycerides is less than 150 mg/dL (8.3 mmol/L). Liver function tests. Kidney function tests. Thyroid function tests.  Dental and eye exams  Visit your dentist two times a year. If you have type 1 diabetes, your health care provider may recommend an eye exam within 5  years after you are diagnosed, and then once a year after your first exam. For children with type 1 diabetes, the health care provider may recommend an eye exam when your child is age 57 or older and has had diabetes for 3-5 years. After the first exam, your child should get an eye exam once a  year. If you have type 2 diabetes, your health care provider may recommend an eye exam as soon as you are diagnosed, and then every 1-2 years after your first exam. Immunizations A yearly flu (influenza) vaccine is recommended annually for everyone 6 months or older. This is especially important if you have diabetes. The pneumonia (pneumococcal) vaccine is recommended for everyone 2 years or older who has diabetes. If you are age 14 or older, you may get the pneumonia vaccine as a series of two separate shots. The hepatitis B vaccine is recommended for adults shortly after being diagnosed with diabetes. Adults and children with diabetes should receive all other vaccines according to age-specific recommendations from the Centers for Disease Control and Prevention (CDC). Mental and emotional health Screening for symptoms of eating disorders, anxiety, and depression is recommended at the time of diagnosis and after as needed. If your screening shows that you have symptoms, you may need more evaluation. You may work with a mental health care provider. Follow these instructions at home: Treatment plan You will monitor your blood glucose levels and may give yourself insulin. Your treatment plan will be reviewed at every medical visit. You and your health care provider will discuss: How you are taking your medicines, including insulin. Any side effects you have. Your blood glucose level target goals. How often you monitor your blood glucose level. Lifestyle habits, such as activity level and tobacco, alcohol, and substance use. Education Your health care provider will assess how well you are monitoring your blood glucose levels and whether you are taking your insulin and medicines correctly. He or she may refer you to: A certified diabetes care and education specialist to manage your diabetes throughout your life, starting at diagnosis. A registered dietitian who can create and review your personal  nutrition plan. An exercise specialist who can discuss your activity level and exercise plan. General instructions Take over-the-counter and prescription medicines only as told by your health care provider. Keep all follow-up visits. This is important. Where to find support There are many diabetes support networks, including: American Diabetes Association (ADA): diabetes.org Defeat Diabetes Foundation: defeatdiabetes.org Where to find more information American Diabetes Association (ADA): www.diabetes.org Association of Diabetes Care & Education Specialists (ADCES): diabeteseducator.org International Diabetes Federation (IDF): https://www.munoz-bell.org/ Summary Managing diabetes (diabetes mellitus) can be complicated. Your diabetes treatment may be managed by a team of health care providers. Your health care providers follow guidelines to help you get the best quality care. You should have physical exams, blood tests, blood pressure monitoring, immunizations, and screening tests regularly. Stay updated on how to manage your diabetes. Your health care providers may also give you more specific instructions based on your individual health. This information is not intended to replace advice given to you by your health care provider. Make sure you discuss any questions you have with your health care provider. Document Revised: 10/16/2019 Document Reviewed: 10/16/2019 Elsevier Patient Education  [place].

## 2021-02-09 NOTE — Progress Notes (Signed)
Subjective   CC:  Chief Complaint  Patient presents with   Diabetes    HPI: Elizabeth Farley is a 60 y.o. female who presents to the office today for follow up of diabetes and problems listed above in the chief complaint.  New onset Diabetes follow up: Her diabetic control is reported as Improved. She has attended diabetic education and is down 10-15 pounds. Eating better. Feels well. Eye exam nl in march of this year.  She denies exertional CP or SOB or symptomatic hypoglycemia. She denies foot sores or paresthesias. Needs nephropathy screen and statin. Needs prevnar 20.  Normotensive LDL > 100; never has been on statin Vaginal bleeding: resolved. None further and benign appearing endometrial strip on TVUS.  C/o bilateral foot and leg pain : AFTER standing on concrete floors for 12 hour shifts. No swelling or injury. Can't find "comfortable" shoes. No calf pain or edema. No paresthesias.   Wt Readings from Last 3 Encounters:  02/09/21 260 lb (117.9 kg)  09/28/20 270 lb (122.5 kg)  09/06/20 274 lb (124.3 kg)    BP Readings from Last 3 Encounters:  02/09/21 126/80  09/06/20 130/84  10/29/17 130/88    Assessment  1. Type 2 diabetes mellitus without complication, without long-term current use of insulin ([place])   2. Combined hyperlipidemia associated with type 2 diabetes mellitus ([place])   3. Postmenopausal vaginal bleeding   4. Screening for colorectal cancer   5. Morbid obesity ([place])   6. Foot pain, bilateral      Plan  Diabetes is currently very well controlled. Diet controlled. Praised. Recheck 75mo. Prevnar 20 today. Has had flu shot. Eye exam up to date. Check urine microalbumin screen HLD; stare crestor 5 nightly. Education given.  Vag bleed resolved. To inform me if recurs Continue weight loss Refer to sm for orthotics/foot pain eval Colonoscopy is scheduled for next month Shingrix 1st dose given today.   Follow up: 15mo dm and lipid recheck. Orders Placed This  Encounter  Procedures   Microalbumin / creatinine urine ratio   Ambulatory referral to Sports Medicine   POCT HgB A1C   Meds ordered this encounter  Medications   rosuvastatin (CRESTOR) 5 MG tablet    Sig: Take 1 tablet (5 mg total) by mouth at bedtime.    Dispense:  90 tablet    Refill:  3       Immunization History  Administered Date(s) Administered   Influenza-Unspecified 02/07/2021   Moderna Sars-Covid-2 Vaccination 04/22/2019, 05/20/2019   Tdap 10/29/2017    Diabetes Related Lab Review: Lab Results  Component Value Date   HGBA1C 5.9 (A) 02/09/2021   HGBA1C 6.7 (H) 09/06/2020   HGBA1C 5.9 (H) 08/23/2015    No results found for: [place] Lab Results  Component Value Date   CREATININE 0.77 09/06/2020   BUN 13 09/06/2020   NA 140 09/06/2020   K 4.1 09/06/2020   CL 103 09/06/2020   CO2 30 09/06/2020   Lab Results  Component Value Date   CHOL 168 09/06/2020   CHOL 188 10/29/2017   CHOL 169 08/23/2015   Lab Results  Component Value Date   HDL 41.70 09/06/2020   HDL 44.10 10/29/2017   HDL 45 (L) 08/23/2015   Lab Results  Component Value Date   LDLCALC 100 (H) 09/06/2020   LDLCALC 111 (H) 10/29/2017   LDLCALC 93 08/23/2015   Lab Results  Component Value Date   TRIG 134.0 09/06/2020   TRIG 165.0 (H)  10/29/2017   TRIG 155 (H) 08/23/2015   Lab Results  Component Value Date   CHOLHDL 4 09/06/2020   CHOLHDL 4 10/29/2017   CHOLHDL 3.8 08/23/2015   No results found for: LDLDIRECT The 10-year ASCVD risk score (Arnett DK, et al., 2019) is: 5.3%   Values used to calculate the score:     Age: 10 years     Sex: Female     Is Non-Hispanic African American: No     Diabetic: Yes     Tobacco smoker: No     Systolic Blood Pressure: 130 mmHg     Is BP treated: No     HDL Cholesterol: 41.7 mg/dL     Total Cholesterol: 168 mg/dL I have reviewed the PMH, Fam and Soc history. Patient Active Problem List   Diagnosis Date Noted   Type 2 diabetes  mellitus without complication, without long-term current use of insulin ([place]) 02/09/2021   Combined hyperlipidemia associated with type 2 diabetes mellitus ([place]) 02/09/2021   Morbid obesity ([place]) 02/09/2021   Subclinical hypothyroidism 10/29/2017   History of ectopic pregnancy 10/29/2017    Social History: Patient  reports that she has never smoked. She has never used smokeless tobacco. She reports that she does not drink alcohol and does not use drugs.  Review of Systems: Ophthalmic: negative for eye pain, loss of vision or double vision Cardiovascular: negative for chest pain Respiratory: negative for SOB or persistent cough Gastrointestinal: negative for abdominal pain Genitourinary: negative for dysuria or gross hematuria MSK: negative for foot lesions Neurologic: negative for weakness or gait disturbance  Objective  Vitals: BP 126/80   Pulse 83   Temp 98.2 F (36.8 C) (Temporal)   Ht 5\' 8"  (1.727 m)   Wt 260 lb (117.9 kg)   SpO2 96%   BMI 39.53 kg/m  General: well appearing, no acute distress  Psych:  Alert and oriented, normal mood and affect HEENT:  Normocephalic, atraumatic, moist mucous membranes, supple neck  Cardiovascular:  Nl S1 and S2, RRR without murmur, gallop or rub. no edema Foot exam: no erythema, pallor, or cyanosis visible nl proprioception and sensation to monofilament testing bilaterally, +2 distal pulses bilaterally    Diabetic education: ongoing education regarding chronic disease management for diabetes was given today. We continue to reinforce the ABC's of diabetic management: A1c (<7 or 8 dependent upon patient), tight blood pressure control, and cholesterol management with goal LDL < 100 minimally. We discuss diet strategies, exercise recommendations, medication options and possible side effects. At each visit, we review recommended immunizations and preventive care recommendations for diabetics and stress that good diabetic control can prevent other  problems. See below for this patient's data.   Commons side effects, risks, benefits, and alternatives for medications and treatment plan prescribed today were discussed, and the patient expressed understanding of the given instructions. Patient is instructed to call or message via MyChart if he/she has any questions or concerns regarding our treatment plan. No barriers to understanding were identified. We discussed Red Flag symptoms and signs in detail. Patient expressed understanding regarding what to do in case of urgent or emergency type symptoms.  Medication list was reconciled, printed and provided to the patient in AVS. Patient instructions and summary information was reviewed with the patient as documented in the AVS. This note was prepared with assistance of Dragon voice recognition software. Occasional wrong-word or sound-a-like substitutions may have occurred due to the inherent limitations of voice recognition software  This visit occurred during  the SARS-CoV-2 public health emergency.  Safety protocols were in place, including screening questions prior to the visit, additional usage of staff PPE, and extensive cleaning of exam room while observing appropriate contact time as indicated for disinfecting solutions.

## 2021-02-10 NOTE — Progress Notes (Signed)
Elizabeth Farley D.[place] Phone: [phone]   Assessment and Plan:     1. Bilateral foot pain 2. Pes planus of both feet -Chronic with exacerbation, initial sports medicine visit - Bilateral plantar foot pain likely due to pes planus, 12-hour shifts on her feet, walking on hard surfaces -Recommend getting well cushioned shoes - Recommend getting inserts for flat feet at [place] talking to PCP for podiatry referral for diabetic foot care and toenail fungal infection See me at needed  3. Onychomycosis -Chronic, initial sports medicine visit - Recommend talking to PCP for podiatry referral for diabetic foot care and toenail fungal infection    Pertinent previous records reviewed include none pertinent   Follow Up: As needed.  Could consider custom orthotics if no improvement   Subjective:    I, Elizabeth Farley, am serving as a scribe for Dr. Benito Farley.   This visit occurred during the SARS-CoV-2 public health emergency.  Safety protocols were in place, including screening questions prior to the visit, additional usage of staff PPE, and extensive cleaning of exam room while observing appropriate contact time as indicated for disinfecting solutions.   Chief Complaint: bilateral foot pain   HPI:   02/11/21 Patient is a 60 year old female presenting with bilateral foot pain and was seen by his PCP 02/11/21 for this pain and was referred to our office for evaluation treatment. Today patient states that she works 12 hour shifts at [employer] and her feet will hurt by the time her shift is over. Feels like her feet are blistered even though they are not. Will go to bed and pain is gone in the morning. Pain throughout the plantar surface. Pain has been chronic. Patient has tried [employer] 900, [employer], [employer]ko, Asics. Also tried OTC orthotics.    Relevant Historical Information: Type II DM, well  controlled  Additional pertinent review of systems negative.   Current Outpatient Medications:    amoxicillin-clavulanate (AUGMENTIN) 875-125 MG tablet, Take 1 tablet by mouth 2 (two) times daily., Disp: 20 tablet, Rfl: 0   chlorpheniramine-HYDROcodone (TUSSIONEX PENNKINETIC ER) 10-8 MG/5ML SUER, Take 5 mLs by mouth at bedtime as needed for cough., Disp: 115 mL, Rfl: 0   famotidine (PEPCID) 10 MG tablet, Take 10 mg by mouth daily as needed for heartburn or indigestion., Disp: , Rfl:    ibuprofen (ADVIL,MOTRIN) 200 MG tablet, Take 400 mg by mouth every 6 (six) hours as needed for mild pain., Disp: , Rfl:    rosuvastatin (CRESTOR) 5 MG tablet, Take 1 tablet (5 mg total) by mouth at bedtime., Disp: 90 tablet, Rfl: 3   Objective:     Vitals:   02/11/21 1024  BP: 120/82  Pulse: (!) 101  SpO2: 97%  Weight: 260 lb (117.9 kg)  Height: 5\' 8"  (1.727 m)      Body mass index is 39.53 kg/m.    Physical Exam:    Gen: Appears well, nad, nontoxic and pleasant Psych: Alert and oriented, appropriate mood and affect Neuro: sensation intact, strength is 5/5 with df/pf/inv/ev, muscle tone wnl Skin: no susupicious lesions or rashes  Bilateral foot and ankle: no deformity, no swelling or effusion Bilateral pes planus with overpronation NTTP over fibular head, lat mal, medial mal, achilles, navicular, base of 5th, ATFL, CFL, deltoid, calcaneous or midfoot ROM DF 30, PF 45, inv/ev intact Negative ant drawer, talar tilt, rotation test, squeeze test. Neg thompson  No pain with resisted inversion or eversion    Electronically signed by:  Elizabeth Farley D.[facility] 10:49 AM 02/11/21

## 2021-02-11 ENCOUNTER — Ambulatory Visit: Payer: 59 | Admitting: Sports Medicine

## 2021-02-11 ENCOUNTER — Other Ambulatory Visit: Payer: Self-pay

## 2021-02-11 VITALS — BP 120/82 | HR 101 | Ht 68.0 in | Wt 260.0 lb

## 2021-02-11 DIAGNOSIS — M79671 Pain in right foot: Secondary | ICD-10-CM | POA: Diagnosis not present

## 2021-02-11 DIAGNOSIS — B351 Tinea unguium: Secondary | ICD-10-CM | POA: Diagnosis not present

## 2021-02-11 DIAGNOSIS — M2141 Flat foot [pes planus] (acquired), right foot: Secondary | ICD-10-CM | POA: Diagnosis not present

## 2021-02-11 DIAGNOSIS — M2142 Flat foot [pes planus] (acquired), left foot: Secondary | ICD-10-CM | POA: Diagnosis not present

## 2021-02-11 DIAGNOSIS — M79672 Pain in left foot: Secondary | ICD-10-CM | POA: Diagnosis not present

## 2021-02-11 NOTE — Patient Instructions (Signed)
Recommend getting well cushioned shoes Recommend getting inserts for flat feet at [place] talking to PCP for podiatry referral for diabetic foot care and toenail fungal infection See me at needed

## 2021-02-23 ENCOUNTER — Encounter: Payer: Self-pay | Admitting: Gastroenterology

## 2021-02-23 ENCOUNTER — Other Ambulatory Visit: Payer: Self-pay

## 2021-02-23 ENCOUNTER — Ambulatory Visit (AMBULATORY_SURGERY_CENTER): Payer: Self-pay | Admitting: *Deleted

## 2021-02-23 VITALS — Ht 68.0 in | Wt 257.0 lb

## 2021-02-23 DIAGNOSIS — Z1211 Encounter for screening for malignant neoplasm of colon: Secondary | ICD-10-CM

## 2021-02-23 MED ORDER — NA SULFATE-K SULFATE-MG SULF 17.5-3.13-1.6 GM/177ML PO SOLN: 1.0000 | ORAL | 0 refills | Status: DC

## 2021-02-23 NOTE — Progress Notes (Signed)
Patient is here in-person for PV. Patient denies any allergies to eggs or soy. Patient denies any problems with anesthesia/sedation. Patient is not on any oxygen at home. Patient is not taking any diet/weight loss medications or blood thinners. Patient is aware of our care-partner policy and Covid-19 safety protocol.   EMMI education assigned to the patient for the procedure, sent to MyChart.   Patient is COVID-19 vaccinated.  

## 2021-02-24 ENCOUNTER — Encounter (HOSPITAL_COMMUNITY): Payer: Self-pay | Admitting: *Deleted

## 2021-02-24 ENCOUNTER — Emergency Department (HOSPITAL_COMMUNITY): Payer: 59

## 2021-02-24 ENCOUNTER — Emergency Department (HOSPITAL_COMMUNITY)
Admission: EM | Admit: 2021-02-24 | Discharge: 2021-02-24 | Disposition: A | Discharge status: Home / Self Care | Payer: 59 | Attending: Emergency Medicine | Admitting: Emergency Medicine

## 2021-02-24 DIAGNOSIS — S2242XA Multiple fractures of ribs, left side, initial encounter for closed fracture: Secondary | ICD-10-CM | POA: Diagnosis not present

## 2021-02-24 DIAGNOSIS — S2232XA Fracture of one rib, left side, initial encounter for closed fracture: Secondary | ICD-10-CM | POA: Insufficient documentation

## 2021-02-24 DIAGNOSIS — E039 Hypothyroidism, unspecified: Secondary | ICD-10-CM | POA: Diagnosis not present

## 2021-02-24 DIAGNOSIS — E119 Type 2 diabetes mellitus without complications: Secondary | ICD-10-CM | POA: Insufficient documentation

## 2021-02-24 DIAGNOSIS — S299XXA Unspecified injury of thorax, initial encounter: Secondary | ICD-10-CM | POA: Diagnosis present

## 2021-02-24 DIAGNOSIS — Y92410 Unspecified street and highway as the place of occurrence of the external cause: Secondary | ICD-10-CM | POA: Insufficient documentation

## 2021-02-24 LAB — CBC WITH DIFFERENTIAL/PLATELET
Abs Immature Granulocytes: 0.07 10*3/uL (ref 0.00–0.07)
Basophils Absolute: 0.1 10*3/uL (ref 0.0–0.1)
Basophils Relative: 1 %
Eosinophils Absolute: 0.3 10*3/uL (ref 0.0–0.5)
Eosinophils Relative: 3 %
HCT: 46.8 % — ABNORMAL HIGH (ref 36.0–46.0)
Hemoglobin: 14.6 g/dL (ref 12.0–15.0)
Immature Granulocytes: 1 %
Lymphocytes Relative: 26 %
Lymphs Abs: 2.6 10*3/uL (ref 0.7–4.0)
MCH: 26.1 pg (ref 26.0–34.0)
MCHC: 31.2 g/dL (ref 30.0–36.0)
MCV: 83.7 fL (ref 80.0–100.0)
Monocytes Absolute: 0.5 10*3/uL (ref 0.1–1.0)
Monocytes Relative: 5 %
Neutro Abs: 6.3 10*3/uL (ref 1.7–7.7)
Neutrophils Relative %: 64 %
Platelets: 323 10*3/uL (ref 150–400)
RBC: 5.59 MIL/uL — ABNORMAL HIGH (ref 3.87–5.11)
RDW: 15.6 % — ABNORMAL HIGH (ref 11.5–15.5)
WBC: 9.8 10*3/uL (ref 4.0–10.5)
nRBC: 0 % (ref 0.0–0.2)

## 2021-02-24 LAB — BASIC METABOLIC PANEL
Anion gap: 7 (ref 5–15)
BUN: 12 mg/dL (ref 6–20)
CO2: 28 mmol/L (ref 22–32)
Calcium: 9.1 mg/dL (ref 8.9–10.3)
Chloride: 103 mmol/L (ref 98–111)
Creatinine, Ser: 0.74 mg/dL (ref 0.44–1.00)
GFR, Estimated: >60 mL/min (ref >60)
Glucose, Bld: 173 mg/dL — ABNORMAL HIGH (ref 70–99)
Potassium: 3.8 mmol/L (ref 3.5–5.1)
Sodium: 138 mmol/L (ref 135–145)

## 2021-02-24 LAB — D-DIMER, QUANTITATIVE: D-Dimer, Quant: 0.28 ug/mL-FEU (ref 0.00–0.50)

## 2021-02-24 NOTE — ED Triage Notes (Signed)
Pain in lrft side of back onset 2 hours ago while driving, hurts to breath

## 2021-02-24 NOTE — ED Provider Notes (Signed)
[facility] EMERGENCY DEPARTMENT Provider Note   CSN: [id number] Arrival date & time: 02/24/21  1732     History Chief Complaint  Patient presents with   Back Pain    Elizabeth Farley is a 60 y.o. female.  HPI  Patient with significant medical history including diabetes, presents with chief complaint of left upper back pain.  Patient states it started suddenly while she was in the car, describes it as a sharp-like sensation, pain is constant but is worsened with breathing, but she denies  shortness of breath, chest pain, peripheral edema, calf pain no recent surgeries or long car rides.  She has no history of PEs or DVTs, currently not on hormone therapy, she has no significant cardiac history, history of a pneumothorax on the right side unclear etiology.  She denies  trauma to her left side, she  any alleviating factors.  She has no other complaints.  Does not endorse fevers, chills, chest pain, shortness of breath, worsening peripheral edema.  Past Medical History:  Diagnosis Date   Acquired hypothyroidism 10/29/2017   Diabetes mellitus without complication ([place])    diet controlled   Ectopic pregnancy    x 3   Thyroid disease    hypothyroid    Patient Active Problem List   Diagnosis Date Noted   Type 2 diabetes mellitus without complication, without long-term current use of insulin ([place]) 02/09/2021   Combined hyperlipidemia associated with type 2 diabetes mellitus ([place]) 02/09/2021   Morbid obesity ([place]) 02/09/2021   Subclinical hypothyroidism 10/29/2017   History of ectopic pregnancy 10/29/2017    Past Surgical History:  Procedure Laterality Date   DILATATION & CURETTAGE/HYSTEROSCOPY WITH MYOSURE N/A 08/18/2014   Procedure: DILATATION & CURETTAGE/HYSTEROSCOPY WITH MYOSURE;  Surgeon: [name], MD;  Location: [facility];  Service: Gynecology;  Laterality: N/A;   NOSE SURGERY     PELVIC LAPAROSCOPY     x3 due to ectopic pregnancis    RHINOPLASTY     TUBAL LIGATION        OB History     Gravida  5   Para  0   Term  0   Preterm  0   AB  5   Living  0      SAB  3   IAB  0   Ectopic  2   Multiple      Live Births              Family History  Problem Relation Age of Onset   Atrial fibrillation Mother    Diabetes Mother    Hyperlipidemia Mother    Heart disease Father    Diabetes Father    Hyperlipidemia Father    Colon polyps Brother    Colon cancer Maternal Uncle    Cancer Paternal Grandfather    Esophageal cancer Neg Hx    Rectal cancer Neg Hx    Stomach cancer Neg Hx     Social History   Tobacco Use   Smoking status: Never   Smokeless tobacco: Never  Vaping Use   Vaping Use: Never used  Substance Use Topics   Alcohol use: No    Alcohol/week: 0.0 standard drinks   Drug use: No    Home Medications Prior to Admission medications   Medication Sig Start Date End Date Taking? Authorizing Provider  famotidine (PEPCID) 10 MG tablet Take 10 mg by mouth daily as needed for heartburn or indigestion.    [provider]  ibuprofen (  ADVIL,MOTRIN) 200 MG tablet Take 400 mg by mouth every 6 (six) hours as needed for mild pain.    [provider]  Na Sulfate-K Sulfate-Mg Sulf 17.5-3.13-1.6 GM/177ML SOLN Take 1 kit by mouth as directed. May use generic Suprep 02/23/21   Mauri Pole, MD  rosuvastatin (CRESTOR) 5 MG tablet Take 1 tablet (5 mg total) by mouth at bedtime. 02/09/21   Leamon Arnt, MD    Allergies    Patient has no known allergies.  Review of Systems   Review of Systems  Constitutional:  Negative for chills and fever.  HENT:  Negative for congestion.   Respiratory:  Negative for shortness of breath.   Cardiovascular:  Negative for chest pain.  Gastrointestinal:  Negative for abdominal pain, nausea and vomiting.  Genitourinary:  Negative for enuresis.  Musculoskeletal:  Negative for back pain.       Left upper back.  Skin:  Negative for rash.  Neurological:  Negative for  dizziness.  Hematological:  Does not bruise/bleed easily.   Physical Exam Updated Vital Signs BP (!) 146/84 (BP Location: Right Arm)   Pulse 87   Temp 97.6 F (36.4 C) (Oral)   Resp 18   SpO2 91%   Physical Exam Vitals and nursing note reviewed.  Constitutional:      General: She is not in acute distress.    Appearance: She is not ill-appearing.  HENT:     Head: Normocephalic and atraumatic.     Nose: No congestion.  Eyes:     Conjunctiva/sclera: Conjunctivae normal.  Cardiovascular:     Rate and Rhythm: Normal rate and regular rhythm.     Pulses: Normal pulses.     Heart sounds: No murmur heard.   No friction rub. No gallop.  Pulmonary:     Effort: No respiratory distress.     Breath sounds: No wheezing, rhonchi or rales.  Chest:     Chest wall: No tenderness.  Abdominal:     Palpations: Abdomen is soft.     Tenderness: There is no abdominal tenderness. There is no right CVA tenderness or left CVA tenderness.  Musculoskeletal:     Comments: Spine was palpated was nontender to palpation, no step-off or deformities present.  She did have tenderness along the left posterior aspect of the sixth rib around the scapula, pain is not worsened with shoulder movement, or twisting.  No crepitus/deformities present.  No peripheral edema, no calf tenderness noted.  Skin:    General: Skin is warm and dry.  Neurological:     Mental Status: She is alert.  Psychiatric:        Mood and Affect: Mood normal.    ED Results / Procedures / Treatments   Labs (all labs ordered are listed, but only abnormal results are displayed) Labs Reviewed  BASIC METABOLIC PANEL - Abnormal; Notable for the following components:      Result Value   Glucose, Bld 173 (*)    All other components within normal limits  CBC WITH DIFFERENTIAL/PLATELET - Abnormal; Notable for the following components:   RBC 5.59 (*)    HCT 46.8 (*)    RDW 15.6 (*)    All other components within normal limits  D-DIMER,  QUANTITATIVE    EKG None  Radiology DG Ribs Unilateral W/Chest Left  Result Date: 02/24/2021 CLINICAL DATA:  Tenderness along the posterior aspect of the left sixth rib. Left posterior rib pain, nki, started today EXAM: LEFT RIBS AND CHEST -  3+ VIEW COMPARISON:  Chest x-ray 10/09/2014 FINDINGS: Nondisplaced lateral ninth rib fracture. No acute displaced fracture or other bone lesions are seen involving the left ribs. The heart and mediastinal contours are within normal limits. No focal consolidation. No pulmonary edema. No pleural effusion. No pneumothorax. IMPRESSION: Nondisplaced lateral ninth rib fracture. Electronically Signed   By: Iven Finn M.D.   On: 02/24/2021 18:45    Procedures Procedures   Medications Ordered in ED Medications - No data to display  ED Course  I have reviewed the triage vital signs and the nursing notes.  Pertinent labs & imaging results that were available during my care of the patient were reviewed by me and considered in my medical decision making (see chart for details).    MDM Rules/Calculators/A&P                          Initial impression-presents with pleuritic pain.  She is alert, does not appear in acute stress, vital signs reassuring.Unclear etiology likely this is more muscular but I am concerned for possible PE due to to the pleuritic nature.  Will obtain basic lab work-up, D-dimer chest x-ray and reassess.  Work-up-CBC is unremarkable, BMP shows hyperglycemia of 173, D-dimer is negative.  Rib x-ray reveals nondisplaced lateral ninth rib fracture.  Reassessment-patient is reassessed, she has no complaints this time, vital signs remained stable, updated on imaging, she endorsed that she has not had any recent falls, she did states she had a large cough no history of osteoporosis and/or metastatic disease.  We will provide her with incentive spirometry have her follow-up in 3 weeks time for reevaluation.  Rule out-I have low suspicion for  PE as D-dimer is negative, vital signs are reassuring, nontachypneic, nonhypoxic, nontachycardic.  I have low suspicion for ACS patient denies chest pain, shortness of breath, presentation atypical etiology.  I have low suspicion for pneumothorax as imaging is negative for this.  Low suspicion that patient had been hospitalized due to ninth rib fracture as she is nontachypneic, nonhypoxic, she is required no pain management at this time.  Plan-  Nondisplaced ninth rib fracture-we will recommend she continue with over-the-counter pain medications, provide her her with a incentive spirometer, follow-up with PCP in 3 weeks time for reevaluation.  Given strict return precautions.  Vital signs have remained stable, no indication for hospital admission.    Patient given at home care as well strict return precautions.  Patient verbalized that they understood agreed to said plan.  Final Clinical Impression(s) / ED Diagnoses Final diagnoses:  Closed fracture of one rib of left side, initial encounter    Rx / DC Orders ED Discharge Orders     None        Aron Baba 02/24/21 2011    Noemi Chapel, MD 02/25/21 1429

## 2021-02-24 NOTE — Discharge Instructions (Signed)
You have a rib fracture and likely causing your pain.  I have given a incentive spirometor please use to decrease risk of pneumonia.  Recommend over-the-counter pain medication as needed for pain.  Please follow-up with PCP in 3 weeks time for reevaluation.  Come back to the emergency department if you develop chest pain, shortness of breath, severe abdominal pain, uncontrolled nausea, vomiting, diarrhea.

## 2021-02-24 NOTE — ED Notes (Signed)
Pt given incentive spirometer with instructions for use. Verbalized understanding.

## 2021-03-09 ENCOUNTER — Ambulatory Visit (AMBULATORY_SURGERY_CENTER): Payer: 59 | Admitting: Gastroenterology

## 2021-03-09 ENCOUNTER — Other Ambulatory Visit: Payer: Self-pay

## 2021-03-09 ENCOUNTER — Encounter: Payer: Self-pay | Admitting: Gastroenterology

## 2021-03-09 VITALS — BP 144/82 | HR 78 | Temp 97.8°F | Resp 14 | Ht 68.0 in | Wt 257.0 lb

## 2021-03-09 DIAGNOSIS — D122 Benign neoplasm of ascending colon: Secondary | ICD-10-CM

## 2021-03-09 DIAGNOSIS — E079 Disorder of thyroid, unspecified: Secondary | ICD-10-CM | POA: Diagnosis not present

## 2021-03-09 DIAGNOSIS — E119 Type 2 diabetes mellitus without complications: Secondary | ICD-10-CM | POA: Diagnosis not present

## 2021-03-09 DIAGNOSIS — D127 Benign neoplasm of rectosigmoid junction: Secondary | ICD-10-CM

## 2021-03-09 DIAGNOSIS — Z1211 Encounter for screening for malignant neoplasm of colon: Secondary | ICD-10-CM | POA: Diagnosis not present

## 2021-03-09 MED ORDER — SODIUM CHLORIDE 0.9 % IV SOLN: 500.0000 mL | Freq: Once | INTRAVENOUS | Status: DC

## 2021-03-09 NOTE — Progress Notes (Signed)
[facility] History and Physical   Primary Care Physician:  Leamon Arnt, MD   Reason for Procedure:  Colorectal cancer screening  Plan:    Screening colonoscopy with possible interventions as needed     HPI: Elizabeth Farley is a very pleasant 60 y.o. female here for screening colonoscopy. Denies any nausea, vomiting, abdominal pain, melena or bright red blood per rectum  The risks and benefits as well as alternatives of endoscopic procedure(s) have been discussed and reviewed. All questions answered. The patient agrees to proceed.    Past Medical History:  Diagnosis Date   Acquired hypothyroidism 10/29/2017   Diabetes mellitus without complication ([place])    diet controlled   Ectopic pregnancy    x 3   Thyroid disease    hypothyroid    Past Surgical History:  Procedure Laterality Date   DILATATION & CURETTAGE/HYSTEROSCOPY WITH MYOSURE N/A 08/18/2014   Procedure: DILATATION & CURETTAGE/HYSTEROSCOPY WITH MYOSURE;  Surgeon: [name], MD;  Location: [facility];  Service: Gynecology;  Laterality: N/A;   NOSE SURGERY     PELVIC LAPAROSCOPY     x3 due to ectopic pregnancis    RHINOPLASTY     TUBAL LIGATION      Prior to Admission medications   Medication Sig Start Date End Date Taking? Authorizing Provider  rosuvastatin (CRESTOR) 5 MG tablet Take 1 tablet (5 mg total) by mouth at bedtime. 02/09/21  Yes Leamon Arnt, MD  famotidine (PEPCID) 10 MG tablet Take 10 mg by mouth daily as needed for heartburn or indigestion.    [provider]  ibuprofen (ADVIL,MOTRIN) 200 MG tablet Take 400 mg by mouth every 6 (six) hours as needed for mild pain.    [provider]    Current Outpatient Medications  Medication Sig Dispense Refill   rosuvastatin (CRESTOR) 5 MG tablet Take 1 tablet (5 mg total) by mouth at bedtime. 90 tablet 3   famotidine (PEPCID) 10 MG tablet Take 10 mg by mouth daily as needed for heartburn or indigestion.     ibuprofen  (ADVIL,MOTRIN) 200 MG tablet Take 400 mg by mouth every 6 (six) hours as needed for mild pain.     Current Facility-Administered Medications  Medication Dose Route Frequency Provider Last Rate Last Admin   0.9 %  sodium chloride infusion  500 mL Intravenous Once Mauri Pole, MD        Allergies as of 03/09/2021   (No Known Allergies)    Family History  Problem Relation Age of Onset   Atrial fibrillation Mother    Diabetes Mother    Hyperlipidemia Mother    Heart disease Father    Diabetes Father    Hyperlipidemia Father    Colon polyps Brother    Colon cancer Maternal Uncle    Cancer Paternal Grandfather    Esophageal cancer Neg Hx    Rectal cancer Neg Hx    Stomach cancer Neg Hx     Social History   Socioeconomic History   Marital status: Widowed    Spouse name: 2005   Number of children: 0   Years of education: Not on file   Highest education level: Not on file  Occupational History   Occupation: Chartered certified accountant     Employer: [employer]    Comment: PART TIME   Occupation: MANAGES [place]  Tobacco Use   Smoking status: Never   Smokeless tobacco: Never  Vaping Use  Vaping Use: Never used  Substance and Sexual Activity   Alcohol use: No    Alcohol/week: 0.0 standard drinks   Drug use: No   Sexual activity: Yes    Birth control/protection: Surgical  Other Topics Concern   Not on file  Social History Narrative   Not on file   Social Determinants of Health   Financial Resource Strain: Not on file  Food Insecurity: Not on file  Transportation Needs: Not on file  Physical Activity: Not on file  Stress: Not on file  Social Connections: Not on file  Intimate Partner Violence: Not on file    Review of Systems:  All other review of systems negative except as mentioned in the HPI.  Physical Exam: Vital signs in last 24 hours: BP 135/85   Pulse 72   Temp 97.8 F (36.6 C)   Ht 5\' 8"  (1.727 m)   Wt 257 lb  (116.6 kg)   SpO2 96%   BMI 39.08 kg/m     General:   Alert, NAD Lungs:  Clear .   Heart:  Regular rate and rhythm Abdomen:  Soft, nontender and nondistended. Neuro/Psych:  Alert and cooperative. Normal mood and affect. A and O x 3  Reviewed labs, radiology imaging, old records and pertinent past GI work up  Patient is appropriate for planned procedure(s) and anesthesia in an ambulatory setting   K. Denzil Magnuson , MD [phone]

## 2021-03-09 NOTE — Op Note (Signed)
[facility] Patient Name: Elizabeth Farley Procedure Date: 03/09/2021 9:34 AM MRN: [id number] Endoscopist: Mauri Pole , MD Age: 60 Referring MD:  Date of Birth: [birth year] Gender: Female Account #: [id number] Procedure:                Colonoscopy Indications:              Screening for colorectal malignant neoplasm Medicines:                Monitored Anesthesia Care Procedure:                Pre-Anesthesia Assessment:                           - Prior to the procedure, a History and Physical                            was performed, and patient medications and                            allergies were reviewed. The patient's tolerance of                            previous anesthesia was also reviewed. The risks                            and benefits of the procedure and the sedation                            options and risks were discussed with the patient.                            All questions were answered, and informed consent                            was obtained. Prior Anticoagulants: The patient has                            taken no previous anticoagulant or antiplatelet                            agents. ASA Grade Assessment: II - A patient with                            mild systemic disease. After reviewing the risks                            and benefits, the patient was deemed in                            satisfactory condition to undergo the procedure.                           After obtaining informed consent, the colonoscope  was passed under direct vision. Throughout the                            procedure, the patient's blood pressure, pulse, and                            oxygen saturations were monitored continuously. The                            PCF-HQ190L Colonoscope was introduced through the                            anus and advanced to the the cecum, identified by                            appendiceal  orifice and ileocecal valve. The                            colonoscopy was performed without difficulty. The                            patient tolerated the procedure well. The quality                            of the bowel preparation was good. The ileocecal                            valve, appendiceal orifice, and rectum were                            photographed. Scope In: 9:38:58 AM Scope Out: 9:51:07 AM Scope Withdrawal Time: 0 hours 9 minutes 5 seconds  Total Procedure Duration: 0 hours 12 minutes 9 seconds  Findings:                 The perianal and digital rectal examinations were                            normal.                           A 3 mm polyp was found in the recto-sigmoid colon.                            The polyp was sessile. The polyp was removed with a                            cold snare. Resection and retrieval were complete.                           Two semi-pedunculated polyps were found in the                            recto-sigmoid colon and ascending colon. The polyps  were 8 to 11 mm in size. These polyps were removed                            with a hot snare. Resection and retrieval were                            complete.                           Scattered small and large-mouthed diverticula were                            found in the sigmoid colon, descending colon,                            ascending colon and cecum.                           Non-bleeding external and internal hemorrhoids were                            found during retroflexion. The hemorrhoids were                            medium-sized. Complications:            No immediate complications. Estimated Blood Loss:     Estimated blood loss was minimal. Impression:               - One 3 mm polyp at the recto-sigmoid colon,                            removed with a cold snare. Resected and retrieved.                           - Two 8 to 11 mm  polyps at the recto-sigmoid colon                            and in the ascending colon, removed with a hot                            snare. Resected and retrieved.                           - Diverticulosis in the sigmoid colon, in the                            descending colon, in the ascending colon and in the                            cecum.                           - Non-bleeding external and internal hemorrhoids. Recommendation:           - Patient has a  contact number available for                            emergencies. The signs and symptoms of potential                            delayed complications were discussed with the                            patient. Return to normal activities tomorrow.                            Written discharge instructions were provided to the                            patient.                           - Resume previous diet.                           - Continue present medications.                           - Await pathology results.                           - Repeat colonoscopy in 3 - 5 years for                            surveillance based on pathology results. Mauri Pole, MD 03/09/2021 9:57:06 AM This report has been signed electronically.

## 2021-03-09 NOTE — Progress Notes (Signed)
Pt's states no medical or surgical changes since previsit or office visit.   VS taken by CW 

## 2021-03-09 NOTE — Patient Instructions (Addendum)
Resume previous diet and medications. Awaiting pathology results. Repeat Colonoscopy in 3-5 years based on pathology results. No NSAIDS (Non-steroidal anti-inflammatory drugs for 2 weeks.) No ibuprofen, Naproxen etc. Ok to continue Low dose Aspirin 81 mg tablets.  YOU HAD AN ENDOSCOPIC PROCEDURE TODAY AT [facility]:   Refer to the procedure report that was given to you for any specific questions about what was found during the examination.  If the procedure report does not answer your questions, please call your gastroenterologist to clarify.  If you requested that your care partner not be given the details of your procedure findings, then the procedure report has been included in a sealed envelope for you to review at your convenience later.  YOU SHOULD EXPECT: Some feelings of bloating in the abdomen. Passage of more gas than usual.  Walking can help get rid of the air that was put into your GI tract during the procedure and reduce the bloating. If you had a lower endoscopy (such as a colonoscopy or flexible sigmoidoscopy) you may notice spotting of blood in your stool or on the toilet paper. If you underwent a bowel prep for your procedure, you may not have a normal bowel movement for a few days.  Please Note:  You might notice some irritation and congestion in your nose or some drainage.  This is from the oxygen used during your procedure.  There is no need for concern and it should clear up in a day or so.  SYMPTOMS TO REPORT IMMEDIATELY:  Following lower endoscopy (colonoscopy or flexible sigmoidoscopy):  Excessive amounts of blood in the stool  Significant tenderness or worsening of abdominal pains  Swelling of the abdomen that is new, acute  Fever of 100F or higher  For urgent or emergent issues, a gastroenterologist can be reached at any hour by calling [phone]. Do not use MyChart messaging for urgent concerns.    DIET:  We do recommend a small meal at first,  but then you may proceed to your regular diet.  Drink plenty of fluids but you should avoid alcoholic beverages for 24 hours.  ACTIVITY:  You should plan to take it easy for the rest of today and you should NOT DRIVE or use heavy machinery until tomorrow (because of the sedation medicines used during the test).    FOLLOW UP: Our staff will call the number listed on your records 48-72 hours following your procedure to check on you and address any questions or concerns that you may have regarding the information given to you following your procedure. If we do not reach you, we will leave a message.  We will attempt to reach you two times.  During this call, we will ask if you have developed any symptoms of COVID 19. If you develop any symptoms (ie: fever, flu-like symptoms, shortness of breath, cough etc.) before then, please call [phone].  If you test positive for Covid 19 in the 2 weeks post procedure, please call and report this information to [place].    If any biopsies were taken you will be contacted by phone or by letter within the next 1-3 weeks.  Please call us at [phone] if you have not heard about the biopsies in 3 weeks.    SIGNATURES/CONFIDENTIALITY: You and/or your care partner have signed paperwork which will be entered into your electronic medical record.  These signatures attest to the fact that that the information above on your After Visit Summary has been reviewed  and is understood.  Full responsibility of the confidentiality of this discharge information lies with you and/or your care-partner.

## 2021-03-09 NOTE — Progress Notes (Signed)
Called to room to assist during endoscopic procedure.  Patient ID and intended procedure confirmed with present staff. Received instructions for my participation in the procedure from the performing physician.  

## 2021-03-09 NOTE — Progress Notes (Signed)
Report given to PACU, vss 

## 2021-03-11 ENCOUNTER — Telehealth: Payer: Self-pay | Admitting: *Deleted

## 2021-03-11 NOTE — Telephone Encounter (Signed)
  Follow up Call-  Call back number 03/09/2021  Post procedure Call Back phone  # [phone]  Permission to leave phone message Yes  Some recent data might be hidden     Patient questions:  Do you have a fever, pain , or abdominal swelling? No. Pain Score  0 *  Have you tolerated food without any problems? Yes.    Have you been able to return to your normal activities? Yes.    Do you have any questions about your discharge instructions: Diet   No. Medications  No. Follow up visit  No.  Do you have questions or concerns about your Care? No.  Actions: * If pain score is 4 or above: No action needed, pain <4.  Have you developed a fever since your procedure? no  2.   Have you had an respiratory symptoms (SOB or cough) since your procedure? no  3.   Have you tested positive for COVID 19 since your procedure no  4.   Have you had any family members/close contacts diagnosed with the COVID 19 since your procedure?  no   If yes to any of these questions please route to Joylene John, RN and Joella Prince, RN

## 2021-03-23 ENCOUNTER — Other Ambulatory Visit (HOSPITAL_COMMUNITY): Payer: Self-pay

## 2021-03-23 MED ORDER — ROSUVASTATIN CALCIUM 5 MG PO TABS
ORAL_TABLET | ORAL | 3 refills | Status: DC
  Filled 2021-03-23: qty 90, 90d supply, fill #0

## 2021-03-25 ENCOUNTER — Encounter: Payer: Self-pay | Admitting: Gastroenterology

## 2021-03-25 DIAGNOSIS — Z3202 Encounter for pregnancy test, result negative: Secondary | ICD-10-CM | POA: Diagnosis not present

## 2021-03-25 DIAGNOSIS — E669 Obesity, unspecified: Secondary | ICD-10-CM | POA: Diagnosis not present

## 2021-03-25 DIAGNOSIS — E785 Hyperlipidemia, unspecified: Secondary | ICD-10-CM | POA: Diagnosis not present

## 2021-03-25 DIAGNOSIS — Z6838 Body mass index (BMI) 38.0-38.9, adult: Secondary | ICD-10-CM | POA: Diagnosis not present

## 2021-03-25 DIAGNOSIS — E118 Type 2 diabetes mellitus with unspecified complications: Secondary | ICD-10-CM | POA: Diagnosis not present

## 2021-03-25 DIAGNOSIS — R635 Abnormal weight gain: Secondary | ICD-10-CM | POA: Diagnosis not present

## 2021-03-25 DIAGNOSIS — Z724 Inappropriate diet and eating habits: Secondary | ICD-10-CM | POA: Diagnosis not present

## 2021-03-25 DIAGNOSIS — E639 Nutritional deficiency, unspecified: Secondary | ICD-10-CM | POA: Diagnosis not present

## 2021-03-25 DIAGNOSIS — Z713 Dietary counseling and surveillance: Secondary | ICD-10-CM | POA: Diagnosis not present

## 2021-03-29 ENCOUNTER — Telehealth: Payer: Self-pay

## 2021-03-29 NOTE — Telephone Encounter (Signed)
Patient had labs done elsewhere, her TSH came back at a 6.050, offered patient an appointment to come in and discuss labs and possibly recheck TSH, she stated she would like for me to run it by Dr.Andy first.

## 2021-03-29 NOTE — Telephone Encounter (Signed)
Pt called wanting to speak to a nurse. She stated that it is regarding her thyroid. Pt would not give me any other information. Please Advise.

## 2021-03-30 NOTE — Telephone Encounter (Signed)
Spoke with patient, gave a verbal understanding °

## 2021-04-05 DIAGNOSIS — E639 Nutritional deficiency, unspecified: Secondary | ICD-10-CM | POA: Diagnosis not present

## 2021-04-05 DIAGNOSIS — E669 Obesity, unspecified: Secondary | ICD-10-CM | POA: Diagnosis not present

## 2021-04-05 DIAGNOSIS — Z724 Inappropriate diet and eating habits: Secondary | ICD-10-CM | POA: Diagnosis not present

## 2021-04-05 DIAGNOSIS — E118 Type 2 diabetes mellitus with unspecified complications: Secondary | ICD-10-CM | POA: Diagnosis not present

## 2021-04-05 DIAGNOSIS — Z713 Dietary counseling and surveillance: Secondary | ICD-10-CM | POA: Diagnosis not present

## 2021-04-05 DIAGNOSIS — Z6837 Body mass index (BMI) 37.0-37.9, adult: Secondary | ICD-10-CM | POA: Diagnosis not present

## 2021-04-05 DIAGNOSIS — R5383 Other fatigue: Secondary | ICD-10-CM | POA: Diagnosis not present

## 2021-04-05 DIAGNOSIS — E785 Hyperlipidemia, unspecified: Secondary | ICD-10-CM | POA: Diagnosis not present

## 2021-04-12 DIAGNOSIS — E639 Nutritional deficiency, unspecified: Secondary | ICD-10-CM | POA: Diagnosis not present

## 2021-04-12 DIAGNOSIS — E785 Hyperlipidemia, unspecified: Secondary | ICD-10-CM | POA: Diagnosis not present

## 2021-04-12 DIAGNOSIS — R5383 Other fatigue: Secondary | ICD-10-CM | POA: Diagnosis not present

## 2021-04-12 DIAGNOSIS — Z6837 Body mass index (BMI) 37.0-37.9, adult: Secondary | ICD-10-CM | POA: Diagnosis not present

## 2021-04-12 DIAGNOSIS — E118 Type 2 diabetes mellitus with unspecified complications: Secondary | ICD-10-CM | POA: Diagnosis not present

## 2021-04-12 DIAGNOSIS — Z713 Dietary counseling and surveillance: Secondary | ICD-10-CM | POA: Diagnosis not present

## 2021-04-12 DIAGNOSIS — Z724 Inappropriate diet and eating habits: Secondary | ICD-10-CM | POA: Diagnosis not present

## 2021-04-12 DIAGNOSIS — E669 Obesity, unspecified: Secondary | ICD-10-CM | POA: Diagnosis not present

## 2021-04-21 ENCOUNTER — Other Ambulatory Visit: Payer: Self-pay

## 2021-04-21 ENCOUNTER — Ambulatory Visit (INDEPENDENT_AMBULATORY_CARE_PROVIDER_SITE_OTHER): Payer: 59 | Admitting: Family Medicine

## 2021-04-21 ENCOUNTER — Encounter: Payer: Self-pay | Admitting: Family Medicine

## 2021-04-21 VITALS — BP 112/74 | HR 84 | Temp 97.8°F | Ht 68.0 in | Wt 243.8 lb

## 2021-04-21 DIAGNOSIS — U071 COVID-19: Secondary | ICD-10-CM

## 2021-04-21 DIAGNOSIS — J029 Acute pharyngitis, unspecified: Secondary | ICD-10-CM

## 2021-04-21 LAB — POCT INFLUENZA A/B
Influenza A, POC: NEGATIVE
Influenza B, POC: NEGATIVE

## 2021-04-21 LAB — POC COVID19 BINAXNOW: SARS Coronavirus 2 Ag: POSITIVE — AB

## 2021-04-21 MED ORDER — NIRMATRELVIR/RITONAVIR (PAXLOVID)TABLET: 3.0000 | ORAL_TABLET | Freq: Two times a day (BID) | ORAL | 0 refills | Status: AC

## 2021-04-21 NOTE — Progress Notes (Signed)
Subjective  CC:  Chief Complaint  Patient presents with   Headache    Present since 12/27   Sore Throat    Present since 12/27   Ear Pain    Bilateral    HPI: Elizabeth Farley is a 60 y.o. female who presents to the office today to address the problems listed above in the chief complaint. 61 year old female with obesity, hyperlipidemia and diet-controlled diabetes presents with headache sore throat mild cough without fever.  Mild body aches.  No GI symptoms.  No shortness of breath or pleuritic chest pain.  Taking fluids but does not have a big appetite.  She has had 2 COVID vaccinations but never had booster.  Normal renal function.  Using over-the-counter medicines with mild symptom relief.  Assessment  1. COVID-19      Plan  COVID-19 infection and high risk patient due to comorbidities: Counseled.  Discussed risk and benefits of antiviral treatment.  Paxlovid ordered.  Port of care with over-the-counter cough and cold medicines.  Advil for sore throat and ear pain.  Symptoms seem mild currently.  She is on day 3 of illness.  Out of work for 5 days.  Note given.  She may need to be out longer.  She will contact [place] [facility].  See after visit summary for further education.  She will drink plenty of fluids.  Discussed possibility of mild hyperglycemia during illness.  She will avoid sweetened beverages etc.  Follow up: As needed 05/17/2021  No orders of the defined types were placed in this encounter.  Meds ordered this encounter  Medications   nirmatrelvir/ritonavir EUA (PAXLOVID) 20 x 150 MG & 10 x 100MG  TABS    Sig: Take 3 tablets by mouth 2 (two) times daily for 5 days. (Take nirmatrelvir 150 mg two tablets twice daily for 5 days and ritonavir 100 mg one tablet twice daily for 5 days) Patient GFR is >60 02/2021    Dispense:  30 tablet    Refill:  0      I reviewed the patients updated PMH, FH, and SocHx.    Patient Active Problem List   Diagnosis Date  Noted   Type 2 diabetes mellitus without complication, without long-term current use of insulin ([place]) 02/09/2021    Priority: High   Combined hyperlipidemia associated with type 2 diabetes mellitus ([place]) 02/09/2021    Priority: High   Morbid obesity ([place]) 02/09/2021    Priority: High   Subclinical hypothyroidism 10/29/2017    Priority: Medium    History of ectopic pregnancy 10/29/2017    Priority: Low   Current Meds  Medication Sig   famotidine (PEPCID) 10 MG tablet Take 10 mg by mouth daily as needed for heartburn or indigestion.   ibuprofen (ADVIL,MOTRIN) 200 MG tablet Take 400 mg by mouth every 6 (six) hours as needed for mild pain.   nirmatrelvir/ritonavir EUA (PAXLOVID) 20 x 150 MG & 10 x 100MG  TABS Take 3 tablets by mouth 2 (two) times daily for 5 days. (Take nirmatrelvir 150 mg two tablets twice daily for 5 days and ritonavir 100 mg one tablet twice daily for 5 days) Patient GFR is >60 02/2021   rosuvastatin (CRESTOR) 5 MG tablet Take one tablet by mouth every night.   [DISCONTINUED] rosuvastatin (CRESTOR) 5 MG tablet Take 1 tablet (5 mg total) by mouth at bedtime.    Allergies: Patient has No Known Allergies. Family History: Patient family history includes Atrial fibrillation in her mother;  Cancer in her paternal grandfather; Colon cancer in her maternal uncle; Colon polyps in her brother; Diabetes in her father and mother; Heart disease in her father; Hyperlipidemia in her father and mother. Social History:  Patient  reports that she has never smoked. She has never used smokeless tobacco. She reports that she does not drink alcohol and does not use drugs.  Review of Systems: Constitutional: Negative for fever or anorexia Cardiovascular: negative for chest pain Respiratory: negative for SOB or productive cough Gastrointestinal: negative for abdominal pain  Objective  Vitals: BP 112/74    Pulse 84    Temp 97.8 F (36.6 C) (Temporal)    Ht 5\' 8"  (1.727 m)    Wt 243 lb 12.8  oz (110.6 kg)    SpO2 98%    BMI 37.07 kg/m  General: no acute distress , A&Ox3, appears well HEENT: PEERL, conjunctiva normal, neck is supple, no lymphadenopathy, posterior pharynx is erythematous without exudate, TMs are normal bilaterally Cardiovascular:  RRR without murmur or gallop.  Respiratory:  Good breath sounds bilaterally, CTAB with normal respiratory effort, no wheezing or rales Skin:  Warm, no rashes  Office Visit on 04/21/2021  Component Date Value Ref Range Status   Influenza A, POC 04/21/2021 Negative  Negative Final   Influenza B, POC 04/21/2021 Negative  Negative Final   SARS Coronavirus 2 Ag 04/21/2021 Positive (A)  Negative Final     Commons side effects, risks, benefits, and alternatives for medications and treatment plan prescribed today were discussed, and the patient expressed understanding of the given instructions. Patient is instructed to call or message via MyChart if he/she has any questions or concerns regarding our treatment plan. No barriers to understanding were identified. We discussed Red Flag symptoms and signs in detail. Patient expressed understanding regarding what to do in case of urgent or emergency type symptoms.  Medication list was reconciled, printed and provided to the patient in AVS. Patient instructions and summary information was reviewed with the patient as documented in the AVS. This note was prepared with assistance of Dragon voice recognition software. Occasional wrong-word or sound-a-like substitutions may have occurred due to the inherent limitations of voice recognition software  This visit occurred during the SARS-CoV-2 public health emergency.  Safety protocols were in place, including screening questions prior to the visit, additional usage of staff PPE, and extensive cleaning of exam room while observing appropriate contact time as indicated for disinfecting solutions.

## 2021-04-21 NOTE — Patient Instructions (Signed)
Please follow up as scheduled for your next visit with me: 05/17/2021   If you have any questions or concerns, please don't hesitate to send me a message via MyChart or call the office at [phone]. Thank you for visiting with [place] today! It's our pleasure caring for you.   COVID-19: What Your Test Results Mean If you test positive for COVID-19 Take steps to protect others regardless of your COVID-19 vaccination status Stay home.  Isolate at home for at least 10 days. Stay in a specific room and away from other people in your home. Get rest and stay hydrated. If you develop symptoms, continue to isolate for at least 10 days after symptoms began and until you do not have a fever without using medications to reduce fever. Stay in touch with your doctor. Contact your doctor as soon as possible if you are an older adult or have underlying medical conditions. Contact your doctor or health department about isolation if you Are severely ill or have a weakened immune system. Had a positive test result followed by a negative result. Test positive for many weeks. If you test negative for COVID-19: The virus was not detected. If you have symptoms of COVID-19: You may have received a false negative test result and still might have COVID-19. Isolate from others. If you do not have symptoms of COVID-19 and you were exposed to a person with COVID-19: You are likely not infected, but you still may get sick. Contact your doctor about your symptoms, about follow-up testing, and how long to isolate. Self-quarantine for 14 days at home after your exposure. If you are fully vaccinated, you do not need to self quarantine. Contact your doctor or local health department regarding options to reduce the length of your quarantine. A negative test result does not mean you won't get sick later. [email or web address] 01/20/2020 This information is not intended to replace advice given to you by your health care  provider. Make sure you discuss any questions you have with your health care provider. Document Revised: 12/31/2020 Document Reviewed: 12/31/2020 Elsevier Patient Education  2022 [place]: What to Do if You Are Sick If you test positive and are an older adult or someone who is at high risk of getting very sick from COVID-19, treatment may be available. Contact a healthcare provider right away after a positive test to determine if you are eligible, even if your symptoms are mild right now. You can also visit a Test to Treat location and, if eligible, receive a prescription from a provider. Don't delay: Treatment must be started within the first few days to be effective. If you have a fever, cough, or other symptoms, you might have COVID-19. Most people have mild illness and are able to recover at home. If you are sick: Keep track of your symptoms. If you have an emergency warning sign (including trouble breathing), call 911. Steps to help prevent the spread of COVID-19 if you are sick If you are sick with COVID-19 or think you might have COVID-19, follow the steps below to care for yourself and to help protect other people in your home and community. Stay home except to get medical care Stay home. Most people with COVID-19 have mild illness and can recover at home without medical care. Do not leave your home, except to get medical care. Do not visit public areas and do not go to places where you are unable to wear a mask. Take care of yourself. Get rest  and stay hydrated. Take over-the-counter medicines, such as acetaminophen, to help you feel better. Stay in touch with your doctor. Call before you get medical care. Be sure to get care if you have trouble breathing, or have any other emergency warning signs, or if you think it is an emergency. Avoid public transportation, ride-sharing, or taxis if possible. Get tested If you have symptoms of COVID-19, get tested. While waiting for  test results, stay away from others, including staying apart from those living in your household. Get tested as soon as possible after your symptoms start. Treatments may be available for people with COVID-19 who are at risk for becoming very sick. Don't delay: Treatment must be started early to be effective--some treatments must begin within 5 days of your first symptoms. Contact your healthcare provider right away if your test result is positive to determine if you are eligible. Self-tests are one of several options for testing for the virus that causes COVID-19 and may be more convenient than laboratory-based tests and point-of-care tests. Ask your healthcare provider or your local health department if you need help interpreting your test results. You can visit your state, tribal, local, and territorial health department's website to look for the latest local information on testing sites. Separate yourself from other people As much as possible, stay in a specific room and away from other people and pets in your home. If possible, you should use a separate bathroom. If you need to be around other people or animals in or outside of the home, wear a well-fitting mask. Tell your close contacts that they may have been exposed to COVID-19. An infected person can spread COVID-19 starting 48 hours (or 2 days) before the person has any symptoms or tests positive. By letting your close contacts know they may have been exposed to COVID-19, you are helping to protect everyone. See COVID-19 and Animals if you have questions about pets. If you are diagnosed with COVID-19, someone from the health department may call you. Answer the call to slow the spread. Monitor your symptoms Symptoms of COVID-19 include fever, cough, or other symptoms. Follow care instructions from your healthcare provider and local health department. Your local health authorities may give instructions on checking your symptoms and reporting  information. When to seek emergency medical attention Look for emergency warning signs* for COVID-19. If someone is showing any of these signs, seek emergency medical care immediately: Trouble breathing Persistent pain or pressure in the chest New confusion Inability to wake or stay awake Pale, gray, or blue-colored skin, lips, or nail beds, depending on skin tone *This list is not all possible symptoms. Please call your medical provider for any other symptoms that are severe or concerning to you. Call 911 or call ahead to your local emergency facility: Notify the operator that you are seeking care for someone who has or may have COVID-19. Call ahead before visiting your doctor Call ahead. Many medical visits for routine care are being postponed or done by phone or telemedicine. If you have a medical appointment that cannot be postponed, call your doctor's office, and tell them you have or may have COVID-19. This will help the office protect themselves and other patients. If you are sick, wear a well-fitting mask You should wear a mask if you must be around other people or animals, including pets (even at home). Wear a mask with the best fit, protection, and comfort for you. You don't need to wear the mask if you are alone.  If you can't put on a mask (because of trouble breathing, for example), cover your coughs and sneezes in some other way. Try to stay at least 6 feet away from other people. This will help protect the people around you. Masks should not be placed on young children under age 66 years, anyone who has trouble breathing, or anyone who is not able to remove the mask without help. Cover your coughs and sneezes Cover your mouth and nose with a tissue when you cough or sneeze. Throw away used tissues in a lined trash can. Immediately wash your hands with soap and water for at least 20 seconds. If soap and water are not available, clean your hands with an alcohol-based hand sanitizer  that contains at least 60% alcohol. Clean your hands often Wash your hands often with soap and water for at least 20 seconds. This is especially important after blowing your nose, coughing, or sneezing; going to the bathroom; and before eating or preparing food. Use hand sanitizer if soap and water are not available. Use an alcohol-based hand sanitizer with at least 60% alcohol, covering all surfaces of your hands and rubbing them together until they feel dry. Soap and water are the best option, especially if hands are visibly dirty. Avoid touching your eyes, nose, and mouth with unwashed hands. Handwashing Tips Avoid sharing personal household items Do not share dishes, drinking glasses, cups, eating utensils, towels, or bedding with other people in your home. Wash these items thoroughly after using them with soap and water or put in the dishwasher. Clean surfaces in your home regularly Clean and disinfect high-touch surfaces (for example, doorknobs, tables, handles, light switches, and countertops) in your "sick room" and bathroom. In shared spaces, you should clean and disinfect surfaces and items after each use by the person who is ill. If you are sick and cannot clean, a caregiver or other person should only clean and disinfect the area around you (such as your bedroom and bathroom) on an as needed basis. Your caregiver/other person should wait as long as possible (at least several hours) and wear a mask before entering, cleaning, and disinfecting shared spaces that you use. Clean and disinfect areas that may have blood, stool, or body fluids on them. Use household cleaners and disinfectants. Clean visible dirty surfaces with household cleaners containing soap or detergent. Then, use a household disinfectant. Use a product from [employer]: Disinfectants for Coronavirus (YKDXI-33). Be sure to follow the instructions on the label to ensure safe and effective use of the product. Many products  recommend keeping the surface wet with a disinfectant for a certain period of time (look at "contact time" on the product label). You may also need to wear personal protective equipment, such as gloves, depending on the directions on the product label. Immediately after disinfecting, wash your hands with soap and water for 20 seconds. For completed guidance on cleaning and disinfecting your home, visit Complete Disinfection Guidance. Take steps to improve ventilation at home Improve ventilation (air flow) at home to help prevent from spreading COVID-19 to other people in your household. Clear out COVID-19 virus particles in the air by opening windows, using air filters, and turning on fans in your home. Use this interactive tool to learn how to improve air flow in your home. When you can be around others after being sick with COVID-19 Deciding when you can be around others is different for different situations. Find out when you can safely end home isolation.  For any additional questions about your care, contact your healthcare provider or state or local health department. 07/13/2020 Content source: [facility] ([facility]), Division of Viral Diseases This information is not intended to replace advice given to you by your health care provider. Make sure you discuss any questions you have with your health care provider. Document Revised: 12/31/2020 Document Reviewed: 12/31/2020 Elsevier Patient Education  [place].

## 2021-05-05 DIAGNOSIS — E785 Hyperlipidemia, unspecified: Secondary | ICD-10-CM | POA: Diagnosis not present

## 2021-05-05 DIAGNOSIS — Z6836 Body mass index (BMI) 36.0-36.9, adult: Secondary | ICD-10-CM | POA: Diagnosis not present

## 2021-05-05 DIAGNOSIS — R5383 Other fatigue: Secondary | ICD-10-CM | POA: Diagnosis not present

## 2021-05-05 DIAGNOSIS — Z724 Inappropriate diet and eating habits: Secondary | ICD-10-CM | POA: Diagnosis not present

## 2021-05-05 DIAGNOSIS — E639 Nutritional deficiency, unspecified: Secondary | ICD-10-CM | POA: Diagnosis not present

## 2021-05-05 DIAGNOSIS — E669 Obesity, unspecified: Secondary | ICD-10-CM | POA: Diagnosis not present

## 2021-05-05 DIAGNOSIS — E118 Type 2 diabetes mellitus with unspecified complications: Secondary | ICD-10-CM | POA: Diagnosis not present

## 2021-05-05 DIAGNOSIS — Z713 Dietary counseling and surveillance: Secondary | ICD-10-CM | POA: Diagnosis not present

## 2021-05-17 ENCOUNTER — Other Ambulatory Visit: Payer: Self-pay

## 2021-05-17 ENCOUNTER — Encounter: Payer: Self-pay | Admitting: Family Medicine

## 2021-05-17 ENCOUNTER — Ambulatory Visit: Payer: 59 | Admitting: Family Medicine

## 2021-05-17 VITALS — BP 122/90 | HR 77 | Temp 97.9°F | Ht 68.0 in | Wt 243.6 lb

## 2021-05-17 DIAGNOSIS — Z713 Dietary counseling and surveillance: Secondary | ICD-10-CM | POA: Diagnosis not present

## 2021-05-17 DIAGNOSIS — Z23 Encounter for immunization: Secondary | ICD-10-CM | POA: Diagnosis not present

## 2021-05-17 DIAGNOSIS — M25519 Pain in unspecified shoulder: Secondary | ICD-10-CM | POA: Diagnosis not present

## 2021-05-17 DIAGNOSIS — E669 Obesity, unspecified: Secondary | ICD-10-CM | POA: Diagnosis not present

## 2021-05-17 DIAGNOSIS — E118 Type 2 diabetes mellitus with unspecified complications: Secondary | ICD-10-CM | POA: Diagnosis not present

## 2021-05-17 DIAGNOSIS — R5383 Other fatigue: Secondary | ICD-10-CM | POA: Diagnosis not present

## 2021-05-17 DIAGNOSIS — E119 Type 2 diabetes mellitus without complications: Secondary | ICD-10-CM

## 2021-05-17 DIAGNOSIS — E639 Nutritional deficiency, unspecified: Secondary | ICD-10-CM | POA: Diagnosis not present

## 2021-05-17 DIAGNOSIS — M25562 Pain in left knee: Secondary | ICD-10-CM | POA: Diagnosis not present

## 2021-05-17 DIAGNOSIS — E1169 Type 2 diabetes mellitus with other specified complication: Secondary | ICD-10-CM

## 2021-05-17 DIAGNOSIS — M5412 Radiculopathy, cervical region: Secondary | ICD-10-CM | POA: Diagnosis not present

## 2021-05-17 DIAGNOSIS — Z6836 Body mass index (BMI) 36.0-36.9, adult: Secondary | ICD-10-CM | POA: Diagnosis not present

## 2021-05-17 DIAGNOSIS — E785 Hyperlipidemia, unspecified: Secondary | ICD-10-CM | POA: Diagnosis not present

## 2021-05-17 DIAGNOSIS — E038 Other specified hypothyroidism: Secondary | ICD-10-CM

## 2021-05-17 DIAGNOSIS — E782 Mixed hyperlipidemia: Secondary | ICD-10-CM

## 2021-05-17 DIAGNOSIS — S40012A Contusion of left shoulder, initial encounter: Secondary | ICD-10-CM | POA: Diagnosis not present

## 2021-05-17 DIAGNOSIS — M25511 Pain in right shoulder: Secondary | ICD-10-CM | POA: Diagnosis not present

## 2021-05-17 DIAGNOSIS — Z724 Inappropriate diet and eating habits: Secondary | ICD-10-CM | POA: Diagnosis not present

## 2021-05-17 LAB — TSH: TSH: 4.26 u[IU]/mL (ref 0.35–5.50)

## 2021-05-17 LAB — COMPREHENSIVE METABOLIC PANEL
ALT: 19 U/L (ref 0–35)
AST: 19 U/L (ref 0–37)
Albumin: 4 g/dL (ref 3.5–5.2)
Alkaline Phosphatase: 69 U/L (ref 39–117)
BUN: 12 mg/dL (ref 6–23)
CO2: 29 mEq/L (ref 19–32)
Calcium: 9.4 mg/dL (ref 8.4–10.5)
Chloride: 102 mEq/L (ref 96–112)
Creatinine, Ser: 0.7 mg/dL (ref 0.40–1.20)
GFR: 95.1 mL/min (ref >60.00)
Glucose, Bld: 87 mg/dL (ref 70–99)
Potassium: 4.5 mEq/L (ref 3.5–5.1)
Sodium: 139 mEq/L (ref 135–145)
Total Bilirubin: 0.4 mg/dL (ref 0.2–1.2)
Total Protein: 7 g/dL (ref 6.0–8.3)

## 2021-05-17 LAB — LIPID PANEL
Cholesterol: 150 mg/dL (ref 0–200)
HDL: 34.6 mg/dL — ABNORMAL LOW (ref >39.00)
LDL Cholesterol: 85 mg/dL (ref 0–99)
NonHDL: 115.24
Total CHOL/HDL Ratio: 4
Triglycerides: 153 mg/dL — ABNORMAL HIGH (ref 0.0–149.0)
VLDL: 30.6 mg/dL (ref 0.0–40.0)

## 2021-05-17 LAB — POCT GLYCOSYLATED HEMOGLOBIN (HGB A1C): Hemoglobin A1C: 5.7 % — AB (ref 4.0–5.6)

## 2021-05-17 LAB — T4, FREE: Free T4: 0.73 ng/dL (ref 0.60–1.60)

## 2021-05-17 LAB — T3: T3, Total: 139 ng/dL (ref 76–181)

## 2021-05-17 NOTE — Patient Instructions (Addendum)
Please return in May for your annual complete physical; please come fasting.   I will release your lab results to you on your MyChart account with further instructions. Please reply with any questions.    Please set up an appointment for a diabetic eye exam and have the results sent to me.  Start taking your crestor once daily!!  Today you have received the 2nd shingrix vaccination.  Glad you are doing so well!  If you have any questions or concerns, please don't hesitate to send me a message via MyChart or call the office at [phone]. Thank you for visiting with [place] today! It's our pleasure caring for you.

## 2021-05-17 NOTE — Progress Notes (Signed)
Subjective  CC:  Chief Complaint  Patient presents with   Diabetes   Hyperlipidemia    HPI: Elizabeth Farley is a 61 y.o. female who presents to the office today for follow up of diabetes and problems listed above in the chief complaint.  Diabetes follow up: Her diabetic control is reported as Improved. [facility] and continues to lose weight. Down 30 pounds overall since onset of DM. Bp is borderline ... says it has been higher since having covid.  She denies exertional CP or SOB or symptomatic hypoglycemia. She denies foot sores or paresthesias. 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 Eye exam is set up for march.  Elevated BP: will monitor.  HLD: hasn't done well with taking her crestor: just forgets. But eating low fat. Fasting today for recheck.  Due 2nd shingrix today. Mammo due in march.   Wt Readings from Last 3 Encounters:  05/17/21 243 lb 9.6 oz (110.5 kg)  04/21/21 243 lb 12.8 oz (110.6 kg)  03/09/21 257 lb (116.6 kg)    BP Readings from Last 3 Encounters:  05/17/21 122/90  04/21/21 112/74  03/09/21 (!) 144/82    Assessment  1. Type 2 diabetes mellitus without complication, without long-term current use of insulin ([place])   2. Combined hyperlipidemia associated with type 2 diabetes mellitus ([place])   3. Subclinical hypothyroidism      Plan  Diabetes is currently very well controlled. Diet controlled.  HLD: will recheck lipids after TLC. Discussed / problem solved on how to be more compliant with crestor 5.  Recheck thyroid levels.  Shingrix updated today. Pt to schedule eye and mammo  Follow up: 5 months for cpe and recheck. Orders Placed This Encounter  Procedures   Comprehensive metabolic panel   Lipid panel   TSH   T3   T4, free   No orders of the defined types were placed in this encounter.     Immunization History  Administered Date(s) Administered   Influenza-Unspecified 02/07/2021    Moderna Sars-Covid-2 Vaccination 04/22/2019, 05/20/2019   PNEUMOCOCCAL CONJUGATE-20 02/09/2021   Tdap 10/29/2017   Zoster Recombinat (Shingrix) 02/09/2021    Diabetes Related Lab Review: Lab Results  Component Value Date   HGBA1C 5.9 (A) 02/09/2021   HGBA1C 6.7 (H) 09/06/2020   HGBA1C 5.9 (H) 08/23/2015    Lab Results  Component Value Date   MICROALBUR 1.3 02/09/2021   Lab Results  Component Value Date   CREATININE 0.74 02/24/2021   BUN 12 02/24/2021   NA 138 02/24/2021   K 3.8 02/24/2021   CL 103 02/24/2021   CO2 28 02/24/2021   Lab Results  Component Value Date   CHOL 168 09/06/2020   CHOL 188 10/29/2017   CHOL 169 08/23/2015   Lab Results  Component Value Date   HDL 41.70 09/06/2020   HDL 44.10 10/29/2017   HDL 45 (L) 08/23/2015   Lab Results  Component Value Date   LDLCALC 100 (H) 09/06/2020   LDLCALC 111 (H) 10/29/2017   LDLCALC 93 08/23/2015   Lab Results  Component Value Date   TRIG 134.0 09/06/2020   TRIG 165.0 (H) 10/29/2017   TRIG 155 (H) 08/23/2015   Lab Results  Component Value Date   CHOLHDL 4 09/06/2020   CHOLHDL 4 10/29/2017   CHOLHDL 3.8 08/23/2015   No results found for: LDLDIRECT The 10-year ASCVD risk score (Arnett DK, et al., 2019)  is: 5%   Values used to calculate the score:     Age: 34 years     Sex: Female     Is Non-Hispanic African American: No     Diabetic: Yes     Tobacco smoker: No     Systolic Blood Pressure: 503 mmHg     Is BP treated: No     HDL Cholesterol: 41.7 mg/dL     Total Cholesterol: 168 mg/dL I have reviewed the PMH, Fam and Soc history. Patient Active Problem List   Diagnosis Date Noted   Type 2 diabetes mellitus without complication, without long-term current use of insulin ([place]) 02/09/2021    Priority: High   Combined hyperlipidemia associated with type 2 diabetes mellitus ([place]) 02/09/2021    Priority: High   Subclinical hypothyroidism 10/29/2017    Priority: Medium    History of ectopic pregnancy  10/29/2017    Priority: Low    Social History: Patient  reports that she has never smoked. She has never used smokeless tobacco. She reports that she does not drink alcohol and does not use drugs.  Review of Systems: Ophthalmic: negative for eye pain, loss of vision or double vision Cardiovascular: negative for chest pain Respiratory: negative for SOB or persistent cough Gastrointestinal: negative for abdominal pain Genitourinary: negative for dysuria or gross hematuria MSK: negative for foot lesions Neurologic: negative for weakness or gait disturbance  Objective  Vitals: BP 122/90    Pulse 77    Temp 97.9 F (36.6 C) (Temporal)    Ht 5\' 8"  (1.727 m)    Wt 243 lb 9.6 oz (110.5 kg)    SpO2 96%    BMI 37.04 kg/m  General: well appearing, no acute distress    Diabetic education: ongoing education regarding chronic disease management for diabetes was given today. We continue to reinforce the ABC's of diabetic management: A1c (<7 or 8 dependent upon patient), tight blood pressure control, and cholesterol management with goal LDL < 100 minimally. We discuss diet strategies, exercise recommendations, medication options and possible side effects. At each visit, we review recommended immunizations and preventive care recommendations for diabetics and stress that good diabetic control can prevent other problems. See below for this patient's data.   Commons side effects, risks, benefits, and alternatives for medications and treatment plan prescribed today were discussed, and the patient expressed understanding of the given instructions. Patient is instructed to call or message via MyChart if he/she has any questions or concerns regarding our treatment plan. No barriers to understanding were identified. We discussed Red Flag symptoms and signs in detail. Patient expressed understanding regarding what to do in case of urgent or emergency type symptoms.  Medication list was reconciled, printed and  provided to the patient in AVS. Patient instructions and summary information was reviewed with the patient as documented in the AVS. This note was prepared with assistance of Dragon voice recognition software. Occasional wrong-word or sound-a-like substitutions may have occurred due to the inherent limitations of voice recognition software  This visit occurred during the SARS-CoV-2 public health emergency.  Safety protocols were in place, including screening questions prior to the visit, additional usage of staff PPE, and extensive cleaning of exam room while observing appropriate contact time as indicated for disinfecting solutions.

## 2021-05-18 ENCOUNTER — Encounter: Payer: Self-pay | Admitting: Family Medicine

## 2021-05-31 DIAGNOSIS — E118 Type 2 diabetes mellitus with unspecified complications: Secondary | ICD-10-CM | POA: Diagnosis not present

## 2021-05-31 DIAGNOSIS — E639 Nutritional deficiency, unspecified: Secondary | ICD-10-CM | POA: Diagnosis not present

## 2021-05-31 DIAGNOSIS — Z724 Inappropriate diet and eating habits: Secondary | ICD-10-CM | POA: Diagnosis not present

## 2021-05-31 DIAGNOSIS — Z713 Dietary counseling and surveillance: Secondary | ICD-10-CM | POA: Diagnosis not present

## 2021-05-31 DIAGNOSIS — E669 Obesity, unspecified: Secondary | ICD-10-CM | POA: Diagnosis not present

## 2021-05-31 DIAGNOSIS — R5383 Other fatigue: Secondary | ICD-10-CM | POA: Diagnosis not present

## 2021-05-31 DIAGNOSIS — Z6836 Body mass index (BMI) 36.0-36.9, adult: Secondary | ICD-10-CM | POA: Diagnosis not present

## 2021-05-31 DIAGNOSIS — E785 Hyperlipidemia, unspecified: Secondary | ICD-10-CM | POA: Diagnosis not present

## 2021-06-14 DIAGNOSIS — E639 Nutritional deficiency, unspecified: Secondary | ICD-10-CM | POA: Diagnosis not present

## 2021-06-14 DIAGNOSIS — E669 Obesity, unspecified: Secondary | ICD-10-CM | POA: Diagnosis not present

## 2021-06-14 DIAGNOSIS — E785 Hyperlipidemia, unspecified: Secondary | ICD-10-CM | POA: Diagnosis not present

## 2021-06-14 DIAGNOSIS — R5383 Other fatigue: Secondary | ICD-10-CM | POA: Diagnosis not present

## 2021-06-14 DIAGNOSIS — Z724 Inappropriate diet and eating habits: Secondary | ICD-10-CM | POA: Diagnosis not present

## 2021-06-14 DIAGNOSIS — E118 Type 2 diabetes mellitus with unspecified complications: Secondary | ICD-10-CM | POA: Diagnosis not present

## 2021-06-14 DIAGNOSIS — Z713 Dietary counseling and surveillance: Secondary | ICD-10-CM | POA: Diagnosis not present

## 2021-06-14 DIAGNOSIS — Z6836 Body mass index (BMI) 36.0-36.9, adult: Secondary | ICD-10-CM | POA: Diagnosis not present

## 2021-06-30 DIAGNOSIS — Z713 Dietary counseling and surveillance: Secondary | ICD-10-CM | POA: Diagnosis not present

## 2021-06-30 DIAGNOSIS — E785 Hyperlipidemia, unspecified: Secondary | ICD-10-CM | POA: Diagnosis not present

## 2021-06-30 DIAGNOSIS — E639 Nutritional deficiency, unspecified: Secondary | ICD-10-CM | POA: Diagnosis not present

## 2021-06-30 DIAGNOSIS — Z6835 Body mass index (BMI) 35.0-35.9, adult: Secondary | ICD-10-CM | POA: Diagnosis not present

## 2021-06-30 DIAGNOSIS — Z724 Inappropriate diet and eating habits: Secondary | ICD-10-CM | POA: Diagnosis not present

## 2021-06-30 DIAGNOSIS — E118 Type 2 diabetes mellitus with unspecified complications: Secondary | ICD-10-CM | POA: Diagnosis not present

## 2021-06-30 DIAGNOSIS — E669 Obesity, unspecified: Secondary | ICD-10-CM | POA: Diagnosis not present

## 2021-06-30 DIAGNOSIS — R5383 Other fatigue: Secondary | ICD-10-CM | POA: Diagnosis not present

## 2021-07-06 DIAGNOSIS — Z1231 Encounter for screening mammogram for malignant neoplasm of breast: Secondary | ICD-10-CM | POA: Diagnosis not present

## 2021-07-06 LAB — HM MAMMOGRAPHY

## 2021-07-19 DIAGNOSIS — E785 Hyperlipidemia, unspecified: Secondary | ICD-10-CM | POA: Diagnosis not present

## 2021-07-19 DIAGNOSIS — Z713 Dietary counseling and surveillance: Secondary | ICD-10-CM | POA: Diagnosis not present

## 2021-07-19 DIAGNOSIS — R5383 Other fatigue: Secondary | ICD-10-CM | POA: Diagnosis not present

## 2021-07-19 DIAGNOSIS — Z724 Inappropriate diet and eating habits: Secondary | ICD-10-CM | POA: Diagnosis not present

## 2021-07-19 DIAGNOSIS — E118 Type 2 diabetes mellitus with unspecified complications: Secondary | ICD-10-CM | POA: Diagnosis not present

## 2021-07-19 DIAGNOSIS — E639 Nutritional deficiency, unspecified: Secondary | ICD-10-CM | POA: Diagnosis not present

## 2021-07-19 DIAGNOSIS — E669 Obesity, unspecified: Secondary | ICD-10-CM | POA: Diagnosis not present

## 2021-07-28 ENCOUNTER — Encounter: Payer: Self-pay | Admitting: Family Medicine

## 2021-08-02 DIAGNOSIS — Z713 Dietary counseling and surveillance: Secondary | ICD-10-CM | POA: Diagnosis not present

## 2021-08-02 DIAGNOSIS — E639 Nutritional deficiency, unspecified: Secondary | ICD-10-CM | POA: Diagnosis not present

## 2021-08-02 DIAGNOSIS — Z724 Inappropriate diet and eating habits: Secondary | ICD-10-CM | POA: Diagnosis not present

## 2021-08-02 DIAGNOSIS — E118 Type 2 diabetes mellitus with unspecified complications: Secondary | ICD-10-CM | POA: Diagnosis not present

## 2021-08-02 DIAGNOSIS — R5383 Other fatigue: Secondary | ICD-10-CM | POA: Diagnosis not present

## 2021-08-02 DIAGNOSIS — E785 Hyperlipidemia, unspecified: Secondary | ICD-10-CM | POA: Diagnosis not present

## 2021-08-02 DIAGNOSIS — E669 Obesity, unspecified: Secondary | ICD-10-CM | POA: Diagnosis not present

## 2021-08-16 DIAGNOSIS — R5383 Other fatigue: Secondary | ICD-10-CM | POA: Diagnosis not present

## 2021-08-16 DIAGNOSIS — Z713 Dietary counseling and surveillance: Secondary | ICD-10-CM | POA: Diagnosis not present

## 2021-08-16 DIAGNOSIS — E639 Nutritional deficiency, unspecified: Secondary | ICD-10-CM | POA: Diagnosis not present

## 2021-08-16 DIAGNOSIS — E118 Type 2 diabetes mellitus with unspecified complications: Secondary | ICD-10-CM | POA: Diagnosis not present

## 2021-08-16 DIAGNOSIS — E669 Obesity, unspecified: Secondary | ICD-10-CM | POA: Diagnosis not present

## 2021-08-16 DIAGNOSIS — Z724 Inappropriate diet and eating habits: Secondary | ICD-10-CM | POA: Diagnosis not present

## 2021-08-16 DIAGNOSIS — E785 Hyperlipidemia, unspecified: Secondary | ICD-10-CM | POA: Diagnosis not present

## 2021-08-24 DIAGNOSIS — H5203 Hypermetropia, bilateral: Secondary | ICD-10-CM | POA: Diagnosis not present

## 2021-08-24 DIAGNOSIS — H524 Presbyopia: Secondary | ICD-10-CM | POA: Diagnosis not present

## 2021-09-07 ENCOUNTER — Ambulatory Visit (INDEPENDENT_AMBULATORY_CARE_PROVIDER_SITE_OTHER): Payer: 59 | Admitting: Family Medicine

## 2021-09-07 ENCOUNTER — Other Ambulatory Visit (HOSPITAL_COMMUNITY): Payer: Self-pay

## 2021-09-07 ENCOUNTER — Encounter: Payer: Self-pay | Admitting: Family Medicine

## 2021-09-07 VITALS — BP 136/80 | HR 65 | Temp 98.0°F | Ht 68.0 in | Wt 232.8 lb

## 2021-09-07 DIAGNOSIS — N3946 Mixed incontinence: Secondary | ICD-10-CM

## 2021-09-07 DIAGNOSIS — E038 Other specified hypothyroidism: Secondary | ICD-10-CM

## 2021-09-07 DIAGNOSIS — E119 Type 2 diabetes mellitus without complications: Secondary | ICD-10-CM | POA: Diagnosis not present

## 2021-09-07 DIAGNOSIS — E6609 Other obesity due to excess calories: Secondary | ICD-10-CM

## 2021-09-07 DIAGNOSIS — Z0000 Encounter for general adult medical examination without abnormal findings: Secondary | ICD-10-CM

## 2021-09-07 DIAGNOSIS — E782 Mixed hyperlipidemia: Secondary | ICD-10-CM | POA: Diagnosis not present

## 2021-09-07 DIAGNOSIS — E1169 Type 2 diabetes mellitus with other specified complication: Secondary | ICD-10-CM

## 2021-09-07 LAB — LIPID PANEL
Cholesterol: 181 mg/dL (ref 0–200)
HDL: 42.6 mg/dL (ref >39.00)
LDL Cholesterol: 100 mg/dL — ABNORMAL HIGH (ref 0–99)
NonHDL: 138.51
Total CHOL/HDL Ratio: 4
Triglycerides: 191 mg/dL — ABNORMAL HIGH (ref 0.0–149.0)
VLDL: 38.2 mg/dL (ref 0.0–40.0)

## 2021-09-07 LAB — CBC WITH DIFFERENTIAL/PLATELET
Basophils Absolute: 0.1 10*3/uL (ref 0.0–0.1)
Basophils Relative: 0.7 % (ref 0.0–3.0)
Eosinophils Absolute: 0.3 10*3/uL (ref 0.0–0.7)
Eosinophils Relative: 3.6 % (ref 0.0–5.0)
HCT: 42.6 % (ref 36.0–46.0)
Hemoglobin: 13.7 g/dL (ref 12.0–15.0)
Lymphocytes Relative: 30.1 % (ref 12.0–46.0)
Lymphs Abs: 2.3 10*3/uL (ref 0.7–4.0)
MCHC: 32.1 g/dL (ref 30.0–36.0)
MCV: 83.3 fl (ref 78.0–100.0)
Monocytes Absolute: 0.5 10*3/uL (ref 0.1–1.0)
Monocytes Relative: 6.9 % (ref 3.0–12.0)
Neutro Abs: 4.6 10*3/uL (ref 1.4–7.7)
Neutrophils Relative %: 58.7 % (ref 43.0–77.0)
Platelets: 293 10*3/uL (ref 150.0–400.0)
RBC: 5.11 Mil/uL (ref 3.87–5.11)
RDW: 15.7 % — ABNORMAL HIGH (ref 11.5–15.5)
WBC: 7.8 10*3/uL (ref 4.0–10.5)

## 2021-09-07 LAB — COMPREHENSIVE METABOLIC PANEL
ALT: 22 U/L (ref 0–35)
AST: 19 U/L (ref 0–37)
Albumin: 4.3 g/dL (ref 3.5–5.2)
Alkaline Phosphatase: 70 U/L (ref 39–117)
BUN: 10 mg/dL (ref 6–23)
CO2: 29 mEq/L (ref 19–32)
Calcium: 10.2 mg/dL (ref 8.4–10.5)
Chloride: 102 mEq/L (ref 96–112)
Creatinine, Ser: 0.75 mg/dL (ref 0.40–1.20)
GFR: 87.35 mL/min (ref >60.00)
Glucose, Bld: 93 mg/dL (ref 70–99)
Potassium: 4.4 mEq/L (ref 3.5–5.1)
Sodium: 140 mEq/L (ref 135–145)
Total Bilirubin: 0.4 mg/dL (ref 0.2–1.2)
Total Protein: 7.6 g/dL (ref 6.0–8.3)

## 2021-09-07 LAB — HEMOGLOBIN A1C: Hgb A1c MFr Bld: 6 % (ref 4.6–6.5)

## 2021-09-07 MED ORDER — OXYBUTYNIN CHLORIDE ER 10 MG PO TB24
10.0000 mg | ORAL_TABLET | Freq: Every day | ORAL | 5 refills | Status: DC
  Filled 2021-09-07: qty 30, 30d supply, fill #0
  Filled 2021-10-14: qty 30, 30d supply, fill #1

## 2021-09-07 MED ORDER — TIRZEPATIDE 2.5 MG/0.5ML ~~LOC~~ SOAJ
2.5000 mg | SUBCUTANEOUS | 0 refills | Status: DC
  Filled 2021-09-07: qty 2, 28d supply, fill #0

## 2021-09-07 NOTE — Progress Notes (Signed)
?Subjective  ?Chief Complaint  ?Patient presents with  ? Annual Exam  ?  Pt here for Annual Exam and is currently fasting. Pt stated that she is having trouble with her bladder and can not hold her pee and have to wear a pad. ? ?  ? ? ?HPI: Elizabeth Farley is a 61 y.o. female who presents to [facility] today for a Female Wellness Visit. She also has the concerns and/or needs as listed above in the chief complaint. These will be addressed in addition to the Health Maintenance Visit.  ? ?Wellness Visit: annual visit with health maintenance review and exam without Pap ? ?Health maintenance: Screens are current.  Immunizations are current.  Continues to work full-time. ?Chronic disease f/u and/or acute problem visit: (deemed necessary to be done in addition to the wellness visit): ?Complains of urinary incontinence mixed, he describes both urge and stress symptoms.  Drinks lots of water during the day.  Empties her bladder but will have large amount of urinary incontinence.,  Wearing a pad daily.  Started 3 to 4 months ago but continued to worsen.  No irritative symptoms.  No hematuria ?Diet-controlled diabetes and serious obesity: Continues with weight loss clinic.  Is down 40 pounds in the last year.  Seems to have hit a plateau.  Interested in weight loss and diabetic medications.  Diabetes was diagnosed about a year ago.  She has not been on other medications.  Continues to exercise ?Subclinical hypothyroidism with normal TSH in January.  Energy levels are good. ?Hyperlipidemia: Continues to struggle with compliance with her Crestor but takes it about 40% of the time.  No adverse effects. ? ?Assessment  ?1. Annual physical exam   ?2. Type 2 diabetes mellitus without complication, without long-term current use of insulin ([place])   ?3. Combined hyperlipidemia associated with type 2 diabetes mellitus ([place])   ?4. Subclinical hypothyroidism   ?5. Obesity due to excess calories with serious  comorbidity, unspecified classification   ?6. Mixed urge and stress incontinence   ? ?  ?Plan  ?Female Wellness Visit: ?Age appropriate Health Maintenance and Prevention measures were discussed with patient. Included topics are cancer screening recommendations, ways to keep healthy (see AVS) including dietary and exercise recommendations, regular eye and dental care, use of seat belts, and avoidance of moderate alcohol use and tobacco use.  ?BMI: discussed patient's BMI and encouraged positive lifestyle modifications to help get to or maintain a target BMI. ?HM needs and immunizations were addressed and ordered. See below for orders. See HM and immunization section for updates. ?Routine labs and screening tests ordered including cmp, cbc and lipids where appropriate. ?Discussed recommendations regarding Vit D and calcium supplementation (see AVS) ? ?Chronic disease management visit and/or acute problem visit: ?Diet-controlled diabetes and obesity: Recheck A1c today.  Check urine nephropathy screen.  Monitor lab work.  Recommend GLP-1 for diabetic control and weight loss.  Counseling done.  Mounjaro ordered. ?Hyperlipidemia: Fasting for recheck.  Goal LDL less than 70.  Continue rosuvastatin 5 mg nightly.  If not yet at goal we will increase to 10. ?Refer to urology.  Counseled regarding incontinence.  Trial of Ditropan. ? ?Follow up: Return in about 6 months (around 03/10/2022) for follow up Diabetes.  ?Orders Placed This Encounter  ?Procedures  ? Hemoglobin A1c  ? CBC with Differential/Platelet  ? Comprehensive metabolic panel  ? Lipid panel  ? Ambulatory referral to Urology  ? ?Meds ordered this encounter  ?Medications  ?  oxybutynin (DITROPAN XL) 10 MG 24 hr tablet  ?  Sig: Take 1 tablet (10 mg total) by mouth at bedtime.  ?  Dispense:  30 tablet  ?  Refill:  5  ? tirzepatide (MOUNJARO) 2.5 MG/0.5ML Pen  ?  Sig: Inject 2.5 mg into the skin once a week.  ?  Dispense:  2 mL  ?  Refill:  0  ? ?  ? ?Body mass index is  35.4 kg/m?. ?Wt Readings from Last 3 Encounters:  ?09/07/21 232 lb 12.8 oz (105.6 kg)  ?05/17/21 243 lb 9.6 oz (110.5 kg)  ?04/21/21 243 lb 12.8 oz (110.6 kg)  ? ? ? ?Patient Active Problem List  ? Diagnosis Date Noted  ? Type 2 diabetes mellitus without complication, without long-term current use of insulin ([place]) 02/09/2021  ?  Priority: High  ? Combined hyperlipidemia associated with type 2 diabetes mellitus ([place]) 02/09/2021  ?  Priority: High  ? Subclinical hypothyroidism 10/29/2017  ?  Priority: Medium   ? History of ectopic pregnancy 10/29/2017  ?  Priority: Low  ? Mixed urge and stress incontinence 09/07/2021  ? ?Health Maintenance  ?Topic Date Due  ? COVID-19 Vaccine (3 - Booster for Moderna series) 09/23/2021 (Originally 07/15/2019)  ? HEMOGLOBIN A1C  11/14/2021  ? INFLUENZA VACCINE  11/22/2021  ? FOOT EXAM  02/09/2022  ? URINE MICROALBUMIN  02/09/2022  ? PAP SMEAR-Modifier  05/14/2022  ? MAMMOGRAM  07/07/2022  ? OPHTHALMOLOGY EXAM  08/25/2022  ? COLONOSCOPY (Pts 45-77yr Insurance coverage will need to be confirmed)  03/09/2024  ? TETANUS/TDAP  10/30/2027  ? Zoster Vaccines- Shingrix  Completed  ? HPV VACCINES  Aged Out  ? Hepatitis C Screening  Discontinued  ? HIV Screening  Discontinued  ? ?Immunization History  ?Administered Date(s) Administered  ? Influenza-Unspecified 02/07/2021  ? Moderna Sars-Covid-2 Vaccination 04/22/2019, 05/20/2019  ? PNEUMOCOCCAL CONJUGATE-20 02/09/2021  ? Tdap 10/29/2017  ? Zoster Recombinat (Shingrix) 02/09/2021, 05/17/2021  ? ?We updated and reviewed the patient's past history in detail and it is documented below. ?Allergies: ?Patient has No Known Allergies. ?Past Medical History ?Patient  has a past medical history of Acquired hypothyroidism (10/29/2017), Diabetes mellitus [place] complication [place], Ectopic pregnancy, and Thyroid disease. ?Past Surgical History ?Patient  has a past surgical history that includes Nose surgery; Tubal ligation; Pelvic laparoscopy; Dilatation &  curettage/hysteroscopy with myosure (N/A, 08/18/2014); and Rhinoplasty. ?Family History: ?Patient family history includes Atrial fibrillation in her mother; Cancer in her paternal grandfather; Colon cancer in her maternal uncle; Colon polyps in her brother; Diabetes in her father and mother; Heart disease in her father; Hyperlipidemia in her father and mother. ?Social History:  ?Patient  reports that she has never smoked. She has never used smokeless tobacco. She reports that she does not drink alcohol and does not use drugs. ? ?Review of Systems: ?Constitutional: negative for fever or malaise ?Ophthalmic: negative for photophobia, double vision or loss of vision ?Cardiovascular: negative for chest pain, dyspnea on exertion, or new LE swelling ?Respiratory: negative for SOB or persistent cough ?Gastrointestinal: negative for abdominal pain, change in bowel habits or melena ?Genitourinary: negative for dysuria or gross hematuria, no abnormal uterine bleeding or disharge ?Musculoskeletal: negative for new gait disturbance or muscular weakness ?Integumentary: negative for new or persistent rashes, no breast lumps ?Neurological: negative for TIA or stroke symptoms ?Psychiatric: negative for SI or delusions ?Allergic/Immunologic: negative for hives ? ?Patient Care Team  ?  Relationship Specialty Notifications Start End  ?ALeamon Arnt MD  PCP - General Family Medicine  10/29/17   ? ? ?Objective  ?Vitals: BP 136/80   Pulse 65   Temp 98 ?F (36.7 ?C)   Ht '5\' 8"'$  (1.727 m)   Wt 232 lb 12.8 oz (105.6 kg)   SpO2 95%   BMI 35.40 kg/m?  ?General:  Well developed, well nourished, no acute distress  ?Psych:  Alert and orientedx3,normal mood and affect ?HEENT:  Normocephalic, atraumatic, non-icteric sclera,  supple neck without adenopathy, mass or thyromegaly ?Cardiovascular:  Normal S1, S2, RRR without gallop, rub or murmur ?Respiratory:  Good breath sounds bilaterally, CTAB with normal respiratory effort ?Gastrointestinal:  normal bowel sounds, soft, non-tender, no noted masses. No HSM ?MSK: no deformities, contusions. Joints are without erythema or swelling.  ?Skin:  Warm, no rashes or suspicious lesions noted ?Neurologic:    [place]

## 2021-09-07 NOTE — Patient Instructions (Signed)
Please return in 6 months to recheck diabetes.  ? ?We will see if we can get the [facility] covered by your insurance. Ozmepic is the other option. ? ?I will release your lab results to you on your MyChart account with further instructions. You may see the results before I do, but when I review them I will send you a message with my report or have my assistant call you if things need to be discussed. Please reply to my message with any questions. Thank you!  ? ?We will call you to get you set up with the urologist. In the interim, try the ditropan daily to see if it helps.  ? ?If you have any questions or concerns, please don't hesitate to send me a message via MyChart or call the office at [phone]. Thank you for visiting with [place] today! It's our pleasure caring for you.  ? ?Urinary Incontinence ?Urinary incontinence refers to a condition in which a person is unable to control where and when to pass urine. A person with this condition will urinate involuntarily. This means that the person urinates when he or she does not mean to. ?What are the causes? ?This condition may be caused by: ?Medicines. ?Infections. ?Constipation. ?Overactive bladder muscles. ?Weak bladder muscles. ?Weak pelvic floor muscles. These muscles provide support for the bladder, intestine, and, in women, the uterus. ?Enlarged prostate in men. The prostate is a gland near the bladder. When it gets too big, it can pinch the urethra. With the urethra blocked, the bladder can weaken and lose the ability to empty properly. ?Surgery. ?Emotional factors, such as anxiety, stress, or post-traumatic stress disorder (PTSD). ?Spinal cord injury, nerve injury, or other neurological conditions. ?Pelvic organ prolapse. This happens in women when organs move out of place and into the vagina. This movement can prevent the bladder and urethra from working properly. ?What increases the risk? ?The following factors may make you more likely to develop this  condition: ?Age. The older you are, the higher the risk. ?Obesity. ?Being physically inactive. ?Pregnancy and childbirth. ?Menopause. ?Diseases that affect the nerves or spinal cord. ?Long-term, or chronic, coughing. This can increase pressure on the bladder and pelvic floor muscles. ?What are the signs or symptoms? ?Symptoms may vary depending on the type of urinary incontinence you have. They include: ?A sudden urge to urinate, and passing urine involuntarily before you can get to a bathroom (urge incontinence). ?Suddenly passing urine when doing activities that force urine to pass, such as coughing, laughing, exercising, or sneezing (stress incontinence). ?Needing to urinate often but urinating only a small amount, or constantly dribbling urine (overflow incontinence). ?Urinating because you cannot get to the bathroom in time due to a physical disability, such as arthritis or injury, or due to a communication or thinking problem, such as Alzheimer's disease (functional incontinence). ?How is this diagnosed? ?This condition may be diagnosed based on: ?Your medical history. ?A physical exam. ?Tests, such as: ?Urine tests. ?X-rays of your kidney and bladder. ?Ultrasound. ?CT scan. ?Cystoscopy. In this procedure, a health care provider inserts a tube with a light and camera (cystoscope) through the urethra and into the bladder to check for problems. ?Urodynamic testing. These tests assess how well the bladder, urethra, and sphincter can store and release urine. There are different types of urodynamic tests, and they vary depending on what the test is measuring. ?To help diagnose your condition, your health care provider may recommend that you keep a log of when you urinate and how  much you urinate. ?How is this treated? ?Treatment for this condition depends on the type of incontinence that you have and its cause. Treatment may include: ?Lifestyle changes, such as: ?Quitting smoking. ?Maintaining a healthy  weight. ?Staying active. Try to get 150 minutes of moderate-intensity exercise every week. Ask your health care provider which activities are safe for you. ?Eating a healthy diet. ?Avoid high-fat foods, like fried foods. ?Avoid refined carbohydrates like white bread and white rice. ?Limit how much alcohol and caffeine you drink. ?Increase your fiber intake. Healthy sources of fiber include beans, whole grains, and fresh fruits and vegetables. ?Behavioral changes, such as: ?Pelvic floor muscle exercises. ?Bladder training, such as lengthening the amount of time between bathroom breaks, or using the bathroom at regular intervals. ?Using techniques to suppress bladder urges. This can include distraction techniques or controlled breathing exercises. ?Medicines, such as: ?Medicines to relax the bladder muscles and prevent bladder spasms. ?Medicines to help slow or prevent the growth of a man's prostate. ?Botox injections. These can help relax the bladder muscles. ?Treatments, such as: ?Using pulses of electricity to help change bladder reflexes (electrical nerve stimulation). ?For women, using a medical device to prevent urine leaks. This is a small, tampon-like, disposable device that is inserted into the urethra. ?Injecting collagen or carbon beads (bulking agents) into the urinary sphincter. These can help thicken tissue and close the bladder opening. ?Surgery. ?Follow these instructions at home: ?Lifestyle ?Limit alcohol and caffeine. These can fill your bladder quickly and irritate it. ?Keep yourself clean to help prevent odors and skin damage. Ask your health care provider about special skin creams and cleansers that can protect the skin from urine. ?Consider wearing pads or adult diapers. Make sure to change them regularly, and always change them right after experiencing incontinence. ?General instructions ?Take over-the-counter and prescription medicines only as told by your health care provider. ?Use the  bathroom about every 3-4 hours, even if you do not feel the need to urinate. Try to empty your bladder completely every time. After urinating, wait a minute. Then try to urinate again. ?Make sure you are in a relaxed position while urinating. ?If your incontinence is caused by nerve problems, keep a log of the medicines you take and the times you go to the bathroom. ?Keep all follow-up visits. This is important. ?Where to find more information ?[employer] of Diabetes and Digestive and Kidney Diseases: [employer] ?American Urology Association: www.urologyhealth.org ?Contact a health care provider if: ?You have pain that gets worse. ?Your incontinence gets worse. ?Get help right away if: ?You have a fever or chills. ?You are unable to urinate. ?You have redness in your groin area or down your legs. ?Summary ?Urinary incontinence refers to a condition in which a person is unable to control where and when to pass urine. ?This condition may be caused by medicines, infection, weak bladder muscles, weak pelvic floor muscles, enlargement of the prostate (in men), or surgery. ?Factors such as older age, obesity, pregnancy and childbirth, menopause, neurological diseases, and chronic coughing may increase your risk for developing this condition. ?Types of urinary incontinence include urge incontinence, stress incontinence, overflow incontinence, and functional incontinence. ?This condition is usually treated first with lifestyle and behavioral changes, such as quitting smoking, eating a healthier diet, and doing regular pelvic floor exercises. Other treatment options include medicines, bulking agents, medical devices, electrical nerve stimulation, or surgery. ?This information is not intended to replace advice given to you by your health care provider. Make sure you  discuss any questions you have with your health care provider. ?Document Revised: 11/14/2019 Document Reviewed: 11/14/2019 ?Elsevier Patient  Education ? [place]. ? ?

## 2021-09-08 ENCOUNTER — Other Ambulatory Visit: Payer: Self-pay

## 2021-09-08 DIAGNOSIS — E1169 Type 2 diabetes mellitus with other specified complication: Secondary | ICD-10-CM

## 2021-09-08 MED ORDER — ROSUVASTATIN CALCIUM 10 MG PO TABS: 10.0000 mg | ORAL_TABLET | Freq: Every day | ORAL | 3 refills | Status: DC

## 2021-09-12 LAB — HM DIABETES EYE EXAM

## 2021-09-13 ENCOUNTER — Other Ambulatory Visit (HOSPITAL_COMMUNITY): Payer: Self-pay

## 2021-09-14 ENCOUNTER — Other Ambulatory Visit (HOSPITAL_COMMUNITY): Payer: Self-pay

## 2021-09-16 ENCOUNTER — Telehealth: Payer: Self-pay

## 2021-09-16 NOTE — Telephone Encounter (Signed)
PA for Darcel Bayley has been sent to plan. Waiting on determination.

## 2021-09-21 ENCOUNTER — Telehealth: Payer: Self-pay

## 2021-09-21 NOTE — Telephone Encounter (Signed)
Mounjaro 2.'5mg'$  has been approved thru [place] from 09/20/2021-09/20/2022

## 2021-09-23 ENCOUNTER — Other Ambulatory Visit (HOSPITAL_COMMUNITY): Payer: Self-pay

## 2021-09-29 ENCOUNTER — Other Ambulatory Visit (HOSPITAL_COMMUNITY): Payer: Self-pay

## 2021-10-14 ENCOUNTER — Other Ambulatory Visit (HOSPITAL_COMMUNITY): Payer: Self-pay

## 2021-10-27 ENCOUNTER — Other Ambulatory Visit: Payer: Self-pay | Admitting: Family Medicine

## 2021-10-27 MED ORDER — TIRZEPATIDE 5 MG/0.5ML ~~LOC~~ SOAJ
5.0000 mg | SUBCUTANEOUS | 0 refills | Status: DC
  Filled 2021-12-03: qty 2, 28d supply, fill #0
  Filled 2022-01-10: qty 2, 28d supply, fill #1

## 2021-10-28 ENCOUNTER — Other Ambulatory Visit (HOSPITAL_COMMUNITY): Payer: Self-pay

## 2021-12-02 ENCOUNTER — Other Ambulatory Visit (HOSPITAL_COMMUNITY): Payer: Self-pay

## 2021-12-03 ENCOUNTER — Other Ambulatory Visit (HOSPITAL_COMMUNITY): Payer: Self-pay

## 2022-01-10 ENCOUNTER — Other Ambulatory Visit (HOSPITAL_COMMUNITY): Payer: Self-pay

## 2022-01-10 ENCOUNTER — Encounter: Payer: Self-pay | Admitting: Family Medicine

## 2022-01-11 ENCOUNTER — Other Ambulatory Visit: Payer: Self-pay

## 2022-01-11 ENCOUNTER — Other Ambulatory Visit (HOSPITAL_COMMUNITY): Payer: Self-pay

## 2022-01-11 MED ORDER — TIRZEPATIDE 7.5 MG/0.5ML ~~LOC~~ SOAJ
7.5000 mg | SUBCUTANEOUS | 2 refills | Status: DC
  Filled 2022-01-11: qty 2, 28d supply, fill #0
  Filled 2022-01-11: qty 6, 84d supply, fill #0
  Filled 2022-02-25: qty 2, 28d supply, fill #1

## 2022-01-11 NOTE — Telephone Encounter (Signed)
Pt to clarify current dose. Can refill next dose up once she does: If she is on 2.'5mg'$ , then order '5mg'$  for 3 months.  If she is on '5mg'$ , then order the 7.'5mg'$  dose.   To [employer] [facility] [employer]. Thanks.  Dr. Jonni Sanger

## 2022-01-14 ENCOUNTER — Other Ambulatory Visit (HOSPITAL_COMMUNITY): Payer: Self-pay

## 2022-01-16 ENCOUNTER — Encounter: Payer: Self-pay | Admitting: *Deleted

## 2022-02-25 ENCOUNTER — Other Ambulatory Visit (HOSPITAL_COMMUNITY): Payer: Self-pay

## 2022-03-08 ENCOUNTER — Encounter: Payer: Self-pay | Admitting: Family Medicine

## 2022-03-08 ENCOUNTER — Ambulatory Visit: Payer: 59 | Admitting: Family Medicine

## 2022-03-08 VITALS — BP 118/70 | HR 76 | Temp 98.1°F | Ht 68.0 in | Wt 216.8 lb

## 2022-03-08 DIAGNOSIS — E1169 Type 2 diabetes mellitus with other specified complication: Secondary | ICD-10-CM | POA: Diagnosis not present

## 2022-03-08 DIAGNOSIS — E782 Mixed hyperlipidemia: Secondary | ICD-10-CM | POA: Diagnosis not present

## 2022-03-08 DIAGNOSIS — E6609 Other obesity due to excess calories: Secondary | ICD-10-CM | POA: Diagnosis not present

## 2022-03-08 DIAGNOSIS — E119 Type 2 diabetes mellitus without complications: Secondary | ICD-10-CM

## 2022-03-08 DIAGNOSIS — N3946 Mixed incontinence: Secondary | ICD-10-CM | POA: Diagnosis not present

## 2022-03-08 LAB — HEPATIC FUNCTION PANEL
ALT: 14 U/L (ref 0–35)
AST: 15 U/L (ref 0–37)
Albumin: 4.3 g/dL (ref 3.5–5.2)
Alkaline Phosphatase: 68 U/L (ref 39–117)
Bilirubin, Direct: 0.1 mg/dL (ref 0.0–0.3)
Total Bilirubin: 0.5 mg/dL (ref 0.2–1.2)
Total Protein: 7.1 g/dL (ref 6.0–8.3)

## 2022-03-08 LAB — LIPID PANEL
Cholesterol: 129 mg/dL (ref 0–200)
HDL: 38.2 mg/dL — ABNORMAL LOW
LDL Cholesterol: 63 mg/dL (ref 0–99)
NonHDL: 90.74
Total CHOL/HDL Ratio: 3
Triglycerides: 137 mg/dL (ref 0.0–149.0)
VLDL: 27.4 mg/dL (ref 0.0–40.0)

## 2022-03-08 LAB — MICROALBUMIN / CREATININE URINE RATIO
Creatinine,U: 103.9 mg/dL
Microalb Creat Ratio: 0.7 mg/g (ref 0.0–30.0)
Microalb, Ur: <0.7 mg/dL (ref 0.0–1.9)

## 2022-03-08 LAB — POCT GLYCOSYLATED HEMOGLOBIN (HGB A1C): Hemoglobin A1C: 5.2 % (ref 4.0–5.6)

## 2022-03-08 MED ORDER — TIRZEPATIDE 7.5 MG/0.5ML ~~LOC~~ SOAJ: 7.5000 mg | SUBCUTANEOUS | 5 refills | Status: DC

## 2022-03-08 NOTE — Patient Instructions (Signed)
Please return in 6 months for your annual complete physical; please come fasting. With pap smear.   I will release your lab results to you on your MyChart account with further instructions. You may see the results before I do, but when I review them I will send you a message with my report or have my assistant call you if things need to be discussed. Please reply to my message with any questions. Thank you!   Glad you are doing so well!  Add strength training to your exercise now.  If you have any questions or concerns, please don't hesitate to send me a message via MyChart or call the office at [phone]. Thank you for visiting with [place] today! It's our pleasure caring for you.

## 2022-03-08 NOTE — Progress Notes (Signed)
Subjective  CC: No chief complaint on file.   HPI: Elizabeth Farley is a 61 y.o. female who presents to the office today for follow up of diabetes and problems listed above in the chief complaint.  Diabetes follow up: Her diabetic control is reported as Improved. Now on moujaro.  She denies exertional CP or SOB or symptomatic hypoglycemia. She denies foot sores or paresthesias.  Weight loss: down > 50 pounds!!! Started at 272. Feels great. No adverse effects from meds. On moujaro 7.'5mg'$  daily. BMI 32 now Urinary incontinence is now much improved on ditropan xl.  HLD now on crestor 10.   Wt Readings from Last 3 Encounters:  03/08/22 216 lb 12.8 oz (98.3 kg)  09/07/21 232 lb 12.8 oz (105.6 kg)  05/17/21 243 lb 9.6 oz (110.5 kg)    BP Readings from Last 3 Encounters:  03/08/22 118/70  09/07/21 136/80  05/17/21 122/90    Assessment  1. Type 2 diabetes mellitus without complication, without long-term current use of insulin ([place])   2. Combined hyperlipidemia associated with type 2 diabetes mellitus ([place])   3. Obesity due to excess calories with serious comorbidity, unspecified classification   4. Mixed urge and stress incontinence      Plan  Diabetes is currently very well controlled. On moujaro. Likely will reverse with continued weight loss. Check urine today. No Ace. Imms up to date HLD: recheck lipids on crestor 10 and lfts. Should be at goal now Continue ditropan xl for mixed urinary incont and urgency. Very effective for her.  Continue weight loss. Will monitor. Continue 7.'5mg'$  mounjaro/no need to increase dose. Add strength training to exercise.   Follow up: 6 mo for cpe and pap. Orders Placed This Encounter  Procedures   Lipid panel   Hepatic function panel   Microalbumin / creatinine urine ratio   POCT HgB A1C   Meds ordered this encounter  Medications   tirzepatide (MOUNJARO) 7.5 MG/0.5ML Pen    Sig: Inject 7.5 mg into the skin once a week.    Dispense:  6 mL     Refill:  5      Immunization History  Administered Date(s) Administered   Influenza,inj,Quad PF,6+ Mos 12/24/2021   Influenza-Unspecified 02/07/2021   Moderna Sars-Covid-2 Vaccination 04/22/2019, 05/20/2019   PNEUMOCOCCAL CONJUGATE-20 02/09/2021   Tdap 10/29/2017   Zoster Recombinat (Shingrix) 02/09/2021, 05/17/2021    Diabetes Related Lab Review: Lab Results  Component Value Date   HGBA1C 5.2 03/08/2022   HGBA1C 6.0 09/07/2021   HGBA1C 5.7 (A) 05/17/2021    Lab Results  Component Value Date   MICROALBUR 1.3 02/09/2021   Lab Results  Component Value Date   CREATININE 0.75 09/07/2021   BUN 10 09/07/2021   NA 140 09/07/2021   K 4.4 09/07/2021   CL 102 09/07/2021   CO2 29 09/07/2021   Lab Results  Component Value Date   CHOL 181 09/07/2021   CHOL 150 05/17/2021   CHOL 168 09/06/2020   Lab Results  Component Value Date   HDL 42.60 09/07/2021   HDL 34.60 (L) 05/17/2021   HDL 41.70 09/06/2020   Lab Results  Component Value Date   LDLCALC 100 (H) 09/07/2021   LDLCALC 85 05/17/2021   LDLCALC 100 (H) 09/06/2020   Lab Results  Component Value Date   TRIG 191.0 (H) 09/07/2021   TRIG 153.0 (H) 05/17/2021   TRIG 134.0 09/06/2020   Lab Results  Component Value Date   CHOLHDL 4 09/07/2021  CHOLHDL 4 05/17/2021   CHOLHDL 4 09/06/2020   No results found for: "LDLDIRECT" The 10-year ASCVD risk score (Arnett DK, et al., 2019) is: 5.4%   Values used to calculate the score:     Age: 48 years     Sex: Female     Is Non-Hispanic African American: No     Diabetic: Yes     Tobacco smoker: No     Systolic Blood Pressure: 782 mmHg     Is BP treated: No     HDL Cholesterol: 42.6 mg/dL     Total Cholesterol: 181 mg/dL I have reviewed the PMH, Fam and Soc history. Patient Active Problem List   Diagnosis Date Noted   Type 2 diabetes mellitus without complication, without long-term current use of insulin ([place]) 02/09/2021    Priority: High   Combined hyperlipidemia  associated with type 2 diabetes mellitus ([place]) 02/09/2021    Priority: High   Subclinical hypothyroidism 10/29/2017    Priority: Medium    History of ectopic pregnancy 10/29/2017    Priority: Low   Obesity due to excess calories with serious comorbidity 03/08/2022   Mixed urge and stress incontinence 09/07/2021    Social History: Patient  reports that she has never smoked. She has never used smokeless tobacco. She reports that she does not drink alcohol and does not use drugs.  Review of Systems: Ophthalmic: negative for eye pain, loss of vision or double vision Cardiovascular: negative for chest pain Respiratory: negative for SOB or persistent cough Gastrointestinal: negative for abdominal pain Genitourinary: negative for dysuria or gross hematuria MSK: negative for foot lesions Neurologic: negative for weakness or gait disturbance  Objective  Vitals: BP 118/70   Pulse 76   Temp 98.1 F (36.7 C)   Ht '5\' 8"'$  (1.727 m)   Wt 216 lb 12.8 oz (98.3 kg)   SpO2 96%   BMI 32.96 kg/m  General: well appearing, no acute distress  Psych:  Alert and oriented, normal mood and affect HEENT:  Normocephalic, atraumatic, moist mucous membranes, supple neck  Cardiovascular:  Nl S1 and S2, RRR without murmur, gallop or rub. no edema Foot exam: no erythema, pallor, or cyanosis visible nl proprioception and sensation to monofilament testing bilaterally, +2 distal pulses bilaterally    Diabetic education: ongoing education regarding chronic disease management for diabetes was given today. We continue to reinforce the ABC's of diabetic management: A1c (<7 or 8 dependent upon patient), tight blood pressure control, and cholesterol management with goal LDL < 100 minimally. We discuss diet strategies, exercise recommendations, medication options and possible side effects. At each visit, we review recommended immunizations and preventive care recommendations for diabetics and stress that good diabetic  control can prevent other problems. See below for this patient's data.   Commons side effects, risks, benefits, and alternatives for medications and treatment plan prescribed today were discussed, and the patient expressed understanding of the given instructions. Patient is instructed to call or message via MyChart if he/she has any questions or concerns regarding our treatment plan. No barriers to understanding were identified. We discussed Red Flag symptoms and signs in detail. Patient expressed understanding regarding what to do in case of urgent or emergency type symptoms.  Medication list was reconciled, printed and provided to the patient in AVS. Patient instructions and summary information was reviewed with the patient as documented in the AVS. This note was prepared with assistance of Dragon voice recognition software. Occasional wrong-word or sound-a-like substitutions may have occurred due  to the inherent limitations of voice recognition software

## 2022-04-10 ENCOUNTER — Other Ambulatory Visit (HOSPITAL_COMMUNITY): Payer: Self-pay

## 2022-04-10 MED ORDER — MOUNJARO 7.5 MG/0.5ML ~~LOC~~ SOAJ
7.5000 mg | SUBCUTANEOUS | 5 refills | Status: DC
  Filled 2022-04-10: qty 2, 28d supply, fill #0
  Filled 2022-06-09: qty 2, 28d supply, fill #1
  Filled 2022-08-28: qty 2, 28d supply, fill #2

## 2022-06-09 ENCOUNTER — Other Ambulatory Visit (HOSPITAL_COMMUNITY): Payer: Self-pay

## 2022-07-19 DIAGNOSIS — Z1231 Encounter for screening mammogram for malignant neoplasm of breast: Secondary | ICD-10-CM | POA: Diagnosis not present

## 2022-07-19 LAB — HM MAMMOGRAPHY

## 2022-08-28 ENCOUNTER — Other Ambulatory Visit (HOSPITAL_COMMUNITY): Payer: Self-pay

## 2022-08-30 ENCOUNTER — Other Ambulatory Visit (HOSPITAL_COMMUNITY): Payer: Self-pay

## 2022-09-19 ENCOUNTER — Encounter: Payer: Self-pay | Admitting: Family Medicine

## 2022-09-20 ENCOUNTER — Encounter: Payer: Self-pay | Admitting: Family Medicine

## 2022-09-20 ENCOUNTER — Ambulatory Visit (INDEPENDENT_AMBULATORY_CARE_PROVIDER_SITE_OTHER): Payer: Commercial Managed Care - PPO | Admitting: Family Medicine

## 2022-09-20 ENCOUNTER — Other Ambulatory Visit (HOSPITAL_COMMUNITY)
Admission: RE | Admit: 2022-09-20 | Discharge: 2022-09-20 | Disposition: A | Discharge status: Home / Self Care | Payer: Commercial Managed Care - PPO | Source: Ambulatory Visit | Attending: Family Medicine | Admitting: Family Medicine

## 2022-09-20 ENCOUNTER — Other Ambulatory Visit (HOSPITAL_COMMUNITY): Payer: Self-pay

## 2022-09-20 VITALS — BP 126/72 | HR 80 | Temp 97.6°F | Ht 68.0 in | Wt 219.6 lb

## 2022-09-20 DIAGNOSIS — N3946 Mixed incontinence: Secondary | ICD-10-CM | POA: Diagnosis not present

## 2022-09-20 DIAGNOSIS — Z124 Encounter for screening for malignant neoplasm of cervix: Secondary | ICD-10-CM

## 2022-09-20 DIAGNOSIS — R0683 Snoring: Secondary | ICD-10-CM

## 2022-09-20 DIAGNOSIS — E6609 Other obesity due to excess calories: Secondary | ICD-10-CM | POA: Diagnosis not present

## 2022-09-20 DIAGNOSIS — E038 Other specified hypothyroidism: Secondary | ICD-10-CM | POA: Diagnosis not present

## 2022-09-20 DIAGNOSIS — R5383 Other fatigue: Secondary | ICD-10-CM | POA: Diagnosis not present

## 2022-09-20 DIAGNOSIS — Z7985 Long-term (current) use of injectable non-insulin antidiabetic drugs: Secondary | ICD-10-CM | POA: Diagnosis not present

## 2022-09-20 DIAGNOSIS — E1169 Type 2 diabetes mellitus with other specified complication: Secondary | ICD-10-CM | POA: Diagnosis not present

## 2022-09-20 DIAGNOSIS — E119 Type 2 diabetes mellitus without complications: Secondary | ICD-10-CM

## 2022-09-20 DIAGNOSIS — Z0000 Encounter for general adult medical examination without abnormal findings: Secondary | ICD-10-CM | POA: Diagnosis not present

## 2022-09-20 DIAGNOSIS — E782 Mixed hyperlipidemia: Secondary | ICD-10-CM | POA: Diagnosis not present

## 2022-09-20 DIAGNOSIS — Z6833 Body mass index (BMI) 33.0-33.9, adult: Secondary | ICD-10-CM

## 2022-09-20 LAB — COMPREHENSIVE METABOLIC PANEL
ALT: 14 U/L (ref 0–35)
AST: 17 U/L (ref 0–37)
Albumin: 4.1 g/dL (ref 3.5–5.2)
Alkaline Phosphatase: 70 U/L (ref 39–117)
BUN: 14 mg/dL (ref 6–23)
CO2: 27 mEq/L (ref 19–32)
Calcium: 9.4 mg/dL (ref 8.4–10.5)
Chloride: 105 mEq/L (ref 96–112)
Creatinine, Ser: 0.75 mg/dL (ref 0.40–1.20)
GFR: 86.72 mL/min (ref >60.00)
Glucose, Bld: 93 mg/dL (ref 70–99)
Potassium: 4.1 mEq/L (ref 3.5–5.1)
Sodium: 140 mEq/L (ref 135–145)
Total Bilirubin: 0.4 mg/dL (ref 0.2–1.2)
Total Protein: 7.1 g/dL (ref 6.0–8.3)

## 2022-09-20 LAB — TSH: TSH: 3.62 u[IU]/mL (ref 0.35–5.50)

## 2022-09-20 LAB — CBC WITH DIFFERENTIAL/PLATELET
Basophils Absolute: 0 10*3/uL (ref 0.0–0.1)
Basophils Relative: 0.7 % (ref 0.0–3.0)
Eosinophils Absolute: 0.2 10*3/uL (ref 0.0–0.7)
Eosinophils Relative: 3.1 % (ref 0.0–5.0)
HCT: 42.3 % (ref 36.0–46.0)
Hemoglobin: 13.6 g/dL (ref 12.0–15.0)
Lymphocytes Relative: 28.3 % (ref 12.0–46.0)
Lymphs Abs: 1.9 10*3/uL (ref 0.7–4.0)
MCHC: 32.1 g/dL (ref 30.0–36.0)
MCV: 86.3 fl (ref 78.0–100.0)
Monocytes Absolute: 0.3 10*3/uL (ref 0.1–1.0)
Monocytes Relative: 4.9 % (ref 3.0–12.0)
Neutro Abs: 4.2 10*3/uL (ref 1.4–7.7)
Neutrophils Relative %: 63 % (ref 43.0–77.0)
Platelets: 272 10*3/uL (ref 150.0–400.0)
RBC: 4.9 Mil/uL (ref 3.87–5.11)
RDW: 14.1 % (ref 11.5–15.5)
WBC: 6.7 10*3/uL (ref 4.0–10.5)

## 2022-09-20 LAB — HEMOGLOBIN A1C: Hgb A1c MFr Bld: 5.6 % (ref 4.6–6.5)

## 2022-09-20 LAB — LIPID PANEL
Cholesterol: 180 mg/dL (ref 0–200)
HDL: 42.2 mg/dL (ref >39.00)
LDL Cholesterol: 100 mg/dL — ABNORMAL HIGH (ref 0–99)
NonHDL: 137.78
Total CHOL/HDL Ratio: 4
Triglycerides: 187 mg/dL — ABNORMAL HIGH (ref 0.0–149.0)
VLDL: 37.4 mg/dL (ref 0.0–40.0)

## 2022-09-20 LAB — T4, FREE: Free T4: 0.93 ng/dL (ref 0.60–1.60)

## 2022-09-20 MED ORDER — ROSUVASTATIN CALCIUM 10 MG PO TABS
10.0000 mg | ORAL_TABLET | Freq: Every day | ORAL | 3 refills | Status: DC
Start: 2022-09-20 — End: 2022-09-27
  Filled 2022-09-20: qty 90, 90d supply, fill #0

## 2022-09-20 MED ORDER — OXYBUTYNIN CHLORIDE ER 10 MG PO TB24
10.0000 mg | ORAL_TABLET | Freq: Every day | ORAL | 3 refills | Status: AC
  Filled 2022-09-20: qty 90, 90d supply, fill #0
  Filled 2023-05-17: qty 90, 90d supply, fill #1

## 2022-09-20 MED ORDER — TIRZEPATIDE 10 MG/0.5ML ~~LOC~~ SOAJ
10.0000 mg | SUBCUTANEOUS | 5 refills | Status: DC
  Filled 2022-09-20: qty 2, 28d supply, fill #0
  Filled 2022-11-24: qty 2, 28d supply, fill #1
  Filled 2023-02-13: qty 2, 28d supply, fill #2
  Filled 2023-05-17: qty 2, 28d supply, fill #3
  Filled 2023-06-22: qty 2, 28d supply, fill #4

## 2022-09-20 NOTE — Progress Notes (Signed)
Subjective  Chief Complaint  Patient presents with   Annual Exam    Pt here for Annual Exam and is not currently fasting     HPI: Elizabeth Farley is a 62 y.o. female who presents to [facility] [place] today for a Female Wellness Visit. She also has the concerns and/or needs as listed above in the chief complaint. These will be addressed in addition to the Health Maintenance Visit.   Wellness Visit: annual visit with health maintenance review and exam with Pap  HM: due pap. Mammo and CRC screen current. Imms up to date.  Works out with a Psychologist, educational.  Building muscle.  Weight loss has slowed. Chronic disease f/u and/or acute problem visit: (deemed necessary to be done in addition to the wellness visit): DM: No symptoms of hypoglycemia.  Tolerating Mounjaro 7.5 mg weekly but weight has plateaued.  Increased muscle mass.  Has eye exam scheduled in August.  Negative for urinary proteinuria.  Not on an ACE inhibitor.  Normotensive.  On statin.  Immunizations up-to-date Has history of subclinical hypothyroidism.  Complains of fatigue.  Poor sleep.  Sleeps about 4 to 5 hours per night.  Up multiple times due to overactive bladder symptoms.  Has not been taking her Ditropan.  Likely snores.  Used to work the night shift. Tolerating statin.  Nonfasting for recheck today.  Assessment  1. Annual physical exam   2. Cervical cancer screening   3. Type 2 diabetes mellitus without complication, without long-term current use of insulin ([facility])   4. Long-term (current) use of injectable non-insulin antidiabetic drugs   5. Combined hyperlipidemia associated with type 2 diabetes mellitus ([facility])   6. Subclinical hypothyroidism   7. Mixed urge and stress incontinence   8. Class 1 obesity due to excess calories with serious comorbidity and body mass index (BMI) of 33.0 to 33.9 in adult   9. Other fatigue   10. Snoring      Plan  Female Wellness Visit: Age appropriate Health Maintenance and  Prevention measures were discussed with patient. Included topics are cancer screening recommendations, ways to keep healthy (see AVS) including dietary and exercise recommendations, regular eye and dental care, use of seat belts, and avoidance of moderate alcohol use and tobacco use.  Pap smear with HPV today BMI: discussed patient's BMI and encouraged positive lifestyle modifications to help get to or maintain a target BMI. HM needs and immunizations were addressed and ordered. See below for orders. See HM and immunization section for updates. Routine labs and screening tests ordered including cmp, cbc and lipids where appropriate. Discussed recommendations regarding Vit D and calcium supplementation (see AVS)  Chronic disease management visit and/or acute problem visit: Diabetes: Suspect normal A1c.  Will recheck.  Obesity: Will increase Mounjaro to 10 mg daily.  Continue working on diet.  Eye exam is scheduled. Hyperlipidemia on Crestor 10.  Recheck nonfasting lipids today.  Check LFTs Fatigue: Likely multifactorial but refer for sleep study.  Check lab work.  Monitoring thyroid levels. Mixed incontinence with overactive bladder symptoms.  Restart Ditropan LA 10 mg daily  Follow up: 6 months to recheck Orders Placed This Encounter  Procedures   CBC with Differential/Platelet   Comprehensive metabolic panel   Lipid panel   TSH   Hemoglobin A1c   T4, free   T3   Ambulatory referral to Pulmonology   Meds ordered this encounter  Medications   oxybutynin (DITROPAN XL) 10 MG 24 hr tablet  Sig: Take 1 tablet (10 mg total) by mouth at bedtime.    Dispense:  90 tablet    Refill:  3   rosuvastatin (CRESTOR) 10 MG tablet    Sig: Take 1 tablet (10 mg total) by mouth daily.    Dispense:  90 tablet    Refill:  3   tirzepatide (MOUNJARO) 10 MG/0.5ML Pen    Sig: Inject 10 mg into the skin once a week.    Dispense:  6 mL    Refill:  5      Body mass index is 33.39 kg/m. Wt Readings  from Last 3 Encounters:  09/20/22 219 lb 9.6 oz (99.6 kg)  03/08/22 216 lb 12.8 oz (98.3 kg)  09/07/21 232 lb 12.8 oz (105.6 kg)     Patient Active Problem List   Diagnosis Date Noted   Type 2 diabetes mellitus without complication, without long-term current use of insulin ([facility]) 02/09/2021    Priority: High   Combined hyperlipidemia associated with type 2 diabetes mellitus ([facility]) 02/09/2021    Priority: High   Subclinical hypothyroidism 10/29/2017    Priority: Medium    History of ectopic pregnancy 10/29/2017    Priority: Low   Obesity due to excess calories with serious comorbidity 03/08/2022   Mixed urge and stress incontinence 09/07/2021   Health Maintenance  Topic Date Due   PAP SMEAR-Modifier  05/14/2022   Diabetic kidney evaluation - eGFR measurement  09/08/2022   HEMOGLOBIN A1C  09/06/2022   OPHTHALMOLOGY EXAM  09/13/2022   COVID-19 Vaccine (3 - 2023-24 season) 10/06/2022 (Originally 12/23/2021)   INFLUENZA VACCINE  11/23/2022   Diabetic kidney evaluation - Urine ACR  03/09/2023   MAMMOGRAM  07/19/2023   FOOT EXAM  09/20/2023   Colonoscopy  03/09/2024   DTaP/Tdap/Td (2 - Td or Tdap) 10/30/2027   Zoster Vaccines- Shingrix  Completed   HPV VACCINES  Aged Out   Hepatitis C Screening  Discontinued   HIV Screening  Discontinued   Immunization History  Administered Date(s) Administered   Influenza,inj,Quad PF,6+ Mos 12/24/2021   Influenza-Unspecified 02/07/2021   Moderna Sars-Covid-2 Vaccination 04/22/2019, 05/20/2019   PNEUMOCOCCAL CONJUGATE-20 02/09/2021   Tdap 10/29/2017   Zoster Recombinat (Shingrix) 02/09/2021, 05/17/2021   We updated and reviewed the patient's past history in detail and it is documented below. Allergies: Patient has No Known Allergies. Past Medical History Patient  has a past medical history of Acquired hypothyroidism (10/29/2017), Diabetes mellitus without complication ([facility]), Ectopic pregnancy, and Thyroid disease. Past Surgical  History Patient  has a past surgical history that includes Nose surgery; Tubal ligation; Pelvic laparoscopy; Dilatation & curettage/hysteroscopy with myosure (N/A, 08/18/2014); and Rhinoplasty. Family History: Patient family history includes Atrial fibrillation in her mother; Cancer in her paternal grandfather; Colon cancer in her maternal uncle; Colon polyps in her brother; Diabetes in her father and mother; Heart disease in her father; Hyperlipidemia in her father and mother. Social History:  Patient  reports that she has never smoked. She has never used smokeless tobacco. She reports that she does not drink alcohol and does not use drugs.  Review of Systems: Constitutional: negative for fever or malaise Ophthalmic: negative for photophobia, double vision or loss of vision Cardiovascular: negative for chest pain, dyspnea on exertion, or new LE swelling Respiratory: negative for SOB or persistent cough Gastrointestinal: negative for abdominal pain, change in bowel habits or melena Genitourinary: negative for dysuria or gross hematuria, no abnormal uterine bleeding or disharge Musculoskeletal: negative for new gait disturbance or  muscular weakness Integumentary: negative for new or persistent rashes, no breast lumps Neurological: negative for TIA or stroke symptoms Psychiatric: negative for SI or delusions Allergic/Immunologic: negative for hives  Patient Care Team    Relationship Specialty Notifications Start End  Willow Ora, MD PCP - General Family Medicine  10/29/17     Objective  Vitals: BP 126/72   Pulse 80   Temp 97.6 F (36.4 C)   Ht 5\' 8"  (1.727 m)   Wt 219 lb 9.6 oz (99.6 kg)   SpO2 95%   BMI 33.39 kg/m  General:  Well developed, well nourished, no acute distress  Psych:  Alert and orientedx3,normal mood and affect HEENT:  Normocephalic, atraumatic, non-icteric sclera,  supple neck without adenopathy, mass or thyromegaly Cardiovascular:  Normal S1, S2, RRR without  gallop, rub or murmur Respiratory:  Good breath sounds bilaterally, CTAB with normal respiratory effort Gastrointestinal: normal bowel sounds, soft, non-tender, no noted masses. No HSM MSK: extremities without edema, joints without erythema or swelling Neurologic:    Mental status is normal.  Gross motor and sensory exams are normal.  No tremor Pelvic Exam: Normal external genitalia, no vulvar or vaginal lesions present. Clear cervix w/o CMT. Bimanual exam reveals a nontender fundus w/o masses, nl size. No adnexal masses present. No inguinal adenopathy. A PAP smear was performed.  Diabetic Foot Exam: Appearance - no lesions, ulcers or significant calluses Skin - no sigificant pallor or erythema Normal sensation Pulses - +2 distally bilaterally    Commons side effects, risks, benefits, and alternatives for medications and treatment plan prescribed today were discussed, and the patient expressed understanding of the given instructions. Patient is instructed to call or message via MyChart if he/she has any questions or concerns regarding our treatment plan. No barriers to understanding were identified. We discussed Red Flag symptoms and signs in detail. Patient expressed understanding regarding what to do in case of urgent or emergency type symptoms.  Medication list was reconciled, printed and provided to the patient in AVS. Patient instructions and summary information was reviewed with the patient as documented in the AVS. This note was prepared with assistance of Dragon voice recognition software. Occasional wrong-word or sound-a-like substitutions may have occurred due to the inherent limitations of voice recognition software

## 2022-09-20 NOTE — Patient Instructions (Addendum)
Please return in 3 months to recheck diabetes and blood pressure    I will release your lab results to you on your MyChart account with further instructions. You may see the results before I do, but when I review them I will send you a message with my report or have my assistant call you if things need to be discussed. Please reply to my message with any questions. Thank you!   We will call you to get you scheduled for a sleep study. Please restart your Ditropan for your urinary incontinence and overactive bladder symptoms. I have increased the dose of your Mounjaro to 10 mg daily.  Continue eating well and working out.  Please set up an appointment for a diabetic eye exam and have the results sent to me.   If you have any questions or concerns, please don't hesitate to send me a message via MyChart or call the office at [phone]. Thank you for visiting with [place] today! It's our pleasure caring for you.   Please do these things to maintain good health!  Exercise at least 30-45 minutes a day,  4-5 days a week.  Eat a low-fat diet with lots of fruits and vegetables, up to 7-9 servings per day. Drink plenty of water daily. Try to drink 8 8oz glasses per day. Seatbelts can save your life. Always wear your seatbelt. Place Smoke Detectors on every level of your home and check batteries every year. Schedule an appointment with an eye doctor for an eye exam every 1-2 years Safe sex - use condoms to protect yourself from STDs if you could be exposed to these types of infections. Use birth control if you do not want to become pregnant and are sexually active. Avoid heavy alcohol use. If you drink, keep it to less than 2 drinks/day and not every day. Health Care Power of Attorney.  Choose someone you trust that could speak for you if you became unable to speak for yourself. Depression is common in our stressful world.If you're feeling down or losing interest in things you normally enjoy, please come in  for a visit. If anyone is threatening or hurting you, please get help. Physical or Emotional Violence is never OK.

## 2022-09-21 LAB — T3: T3, Total: 103 ng/dL (ref 76–181)

## 2022-09-21 NOTE — Progress Notes (Signed)
See mychart note Dear Ms. Elizabeth Farley, Your lab results look mostly good.  Your diabetes test is now in the normal range!  Congratulations.  However, your cholesterol levels are higher.  Are you taking Crestor 10 mg nightly.  Please restart this.  If you have been taking it nightly, then I recommend increasing the dose if you can tolerate it.  Please let me know.  No other changes are needed at this time. Sincerely, Dr. Mardelle Matte

## 2022-09-25 LAB — CYTOLOGY - PAP
Comment: NEGATIVE
High risk HPV: NEGATIVE

## 2022-09-26 NOTE — Progress Notes (Signed)
Please call patient:please see mychart note. Pt has not yet viewed it. Please give her the instructions.

## 2022-09-27 ENCOUNTER — Other Ambulatory Visit: Payer: Self-pay

## 2022-09-27 ENCOUNTER — Other Ambulatory Visit (HOSPITAL_COMMUNITY): Payer: Self-pay

## 2022-09-27 ENCOUNTER — Encounter: Payer: Self-pay | Admitting: Family Medicine

## 2022-09-27 DIAGNOSIS — R87619 Unspecified abnormal cytological findings in specimens from cervix uteri: Secondary | ICD-10-CM | POA: Insufficient documentation

## 2022-09-27 DIAGNOSIS — E785 Hyperlipidemia, unspecified: Secondary | ICD-10-CM

## 2022-09-27 MED ORDER — ROSUVASTATIN CALCIUM 20 MG PO TABS
20.0000 mg | ORAL_TABLET | Freq: Every day | ORAL | 3 refills | Status: AC
Start: 2022-09-27 — End: ?
  Filled 2022-09-27: qty 90, 90d supply, fill #0
  Filled 2023-05-17: qty 90, 90d supply, fill #1

## 2022-09-27 NOTE — Progress Notes (Signed)
MyChart message sent to pt with pap results and referral has been placed

## 2022-09-27 NOTE — Progress Notes (Signed)
Please call patient: Her Pap test results show some atypical glandular cells that could be coming from her endometrium or uterine lining, and these need to be reassessed.  Please refer to gynecology for further evaluation and management strategies.  Thanks

## 2022-09-29 ENCOUNTER — Other Ambulatory Visit (HOSPITAL_COMMUNITY): Payer: Self-pay

## 2022-10-03 ENCOUNTER — Other Ambulatory Visit (HOSPITAL_COMMUNITY): Payer: Self-pay

## 2022-10-04 ENCOUNTER — Other Ambulatory Visit (HOSPITAL_COMMUNITY): Payer: Self-pay

## 2022-10-09 NOTE — Telephone Encounter (Signed)
Patient requests to be called regarding status of Referral to Gynecologist

## 2022-10-10 ENCOUNTER — Encounter: Payer: Self-pay | Admitting: Family Medicine

## 2022-10-18 DIAGNOSIS — R87619 Unspecified abnormal cytological findings in specimens from cervix uteri: Secondary | ICD-10-CM | POA: Diagnosis not present

## 2022-11-09 ENCOUNTER — Institutional Professional Consult (permissible substitution): Payer: Commercial Managed Care - PPO | Admitting: Nurse Practitioner

## 2022-11-13 DIAGNOSIS — R87619 Unspecified abnormal cytological findings in specimens from cervix uteri: Secondary | ICD-10-CM | POA: Diagnosis not present

## 2022-11-22 ENCOUNTER — Encounter (INDEPENDENT_AMBULATORY_CARE_PROVIDER_SITE_OTHER): Payer: Self-pay

## 2022-11-24 ENCOUNTER — Other Ambulatory Visit (HOSPITAL_COMMUNITY): Payer: Self-pay

## 2022-11-24 ENCOUNTER — Telehealth: Payer: Commercial Managed Care - PPO | Admitting: Physician Assistant

## 2022-11-24 DIAGNOSIS — R3989 Other symptoms and signs involving the genitourinary system: Secondary | ICD-10-CM | POA: Diagnosis not present

## 2022-11-24 MED ORDER — CEPHALEXIN 500 MG PO CAPS
500.0000 mg | ORAL_CAPSULE | Freq: Two times a day (BID) | ORAL | 0 refills | Status: DC
Start: 2022-11-24 — End: 2022-12-21
  Filled 2022-11-24: qty 14, 7d supply, fill #0

## 2022-11-24 NOTE — Progress Notes (Signed)

## 2022-12-12 ENCOUNTER — Institutional Professional Consult (permissible substitution): Payer: Commercial Managed Care - PPO | Admitting: Pulmonary Disease

## 2022-12-19 ENCOUNTER — Encounter: Payer: Self-pay | Admitting: Pulmonary Disease

## 2022-12-21 ENCOUNTER — Encounter: Payer: Self-pay | Admitting: Family Medicine

## 2022-12-21 ENCOUNTER — Ambulatory Visit (INDEPENDENT_AMBULATORY_CARE_PROVIDER_SITE_OTHER): Payer: Commercial Managed Care - PPO | Admitting: Family Medicine

## 2022-12-21 VITALS — BP 126/78 | HR 62 | Temp 97.6°F | Ht 68.0 in | Wt 209.8 lb

## 2022-12-21 DIAGNOSIS — E1169 Type 2 diabetes mellitus with other specified complication: Secondary | ICD-10-CM

## 2022-12-21 DIAGNOSIS — E6609 Other obesity due to excess calories: Secondary | ICD-10-CM | POA: Diagnosis not present

## 2022-12-21 DIAGNOSIS — E782 Mixed hyperlipidemia: Secondary | ICD-10-CM | POA: Diagnosis not present

## 2022-12-21 DIAGNOSIS — Z6831 Body mass index (BMI) 31.0-31.9, adult: Secondary | ICD-10-CM

## 2022-12-21 DIAGNOSIS — R87619 Unspecified abnormal cytological findings in specimens from cervix uteri: Secondary | ICD-10-CM | POA: Diagnosis not present

## 2022-12-21 DIAGNOSIS — N3946 Mixed incontinence: Secondary | ICD-10-CM | POA: Diagnosis not present

## 2022-12-21 DIAGNOSIS — Z7985 Long-term (current) use of injectable non-insulin antidiabetic drugs: Secondary | ICD-10-CM | POA: Diagnosis not present

## 2022-12-21 DIAGNOSIS — E119 Type 2 diabetes mellitus without complications: Secondary | ICD-10-CM

## 2022-12-21 LAB — COMPREHENSIVE METABOLIC PANEL
ALT: 13 U/L (ref 0–35)
AST: 15 U/L (ref 0–37)
Albumin: 4.1 g/dL (ref 3.5–5.2)
Alkaline Phosphatase: 61 U/L (ref 39–117)
BUN: 12 mg/dL (ref 6–23)
CO2: 27 mEq/L (ref 19–32)
Calcium: 9.4 mg/dL (ref 8.4–10.5)
Chloride: 105 mEq/L (ref 96–112)
Creatinine, Ser: 0.85 mg/dL (ref 0.40–1.20)
GFR: 74.5 mL/min (ref >60.00)
Glucose, Bld: 87 mg/dL (ref 70–99)
Potassium: 4 mEq/L (ref 3.5–5.1)
Sodium: 138 mEq/L (ref 135–145)
Total Bilirubin: 0.4 mg/dL (ref 0.2–1.2)
Total Protein: 7.1 g/dL (ref 6.0–8.3)

## 2022-12-21 LAB — POCT GLYCOSYLATED HEMOGLOBIN (HGB A1C): Hemoglobin A1C: 5.2 % (ref 4.0–5.6)

## 2022-12-21 LAB — MICROALBUMIN / CREATININE URINE RATIO
Creatinine,U: 158.2 mg/dL
Microalb Creat Ratio: 0.5 mg/g (ref 0.0–30.0)
Microalb, Ur: 0.7 mg/dL (ref 0.0–1.9)

## 2022-12-21 LAB — LIPID PANEL
Cholesterol: 103 mg/dL (ref 0–200)
HDL: 39 mg/dL — ABNORMAL LOW (ref >39.00)
LDL Cholesterol: 44 mg/dL (ref 0–99)
NonHDL: 64.34
Total CHOL/HDL Ratio: 3
Triglycerides: 104 mg/dL (ref 0.0–149.0)
VLDL: 20.8 mg/dL (ref 0.0–40.0)

## 2022-12-21 NOTE — Progress Notes (Signed)
Subjective  CC:  Chief Complaint  Patient presents with   Diabetes    Eye exam has not been scheduled yet   Hypertension    HPI: Elizabeth Farley is a 62 y.o. female who presents to the office today for follow up of diabetes and problems listed above in the chief complaint.  Diabetes follow up: Her diabetic control is reported as Improved. Doing well on mounjaro 10 weekly.  Tolerating well.  Healthy diet.  Exercising 3 times weekly at the gym.  Doing a Systems analyst.  Feels great!  Weight is down 10 pounds in the last 3 months.  She denies exertional CP or SOB or symptomatic hypoglycemia. She denies foot sores or paresthesias.  She will schedule her eye exam. Hyperlipidemia: Increased Crestor to 20 mg nightly 3 months ago.  Tolerating well.  Back for recheck today. Overactive bladder/incontinence: Much improved on Ditropan 10 mg XL nightly Atypical glandular cells on Pap: Reviewed gynecology notes.  He had transvaginal ultrasound, colposcopy with cervical biopsy and endometrial biopsy.  Patient reports all results were negative.  Back to routine follow-up.  Grateful Health maintenance: Will get her flu shot at work this season.  She is a Psychologist, sport and exercise at [employer] Readings from Last 3 Encounters:  12/21/22 209 lb 12.8 oz (95.2 kg)  09/20/22 219 lb 9.6 oz (99.6 kg)  03/08/22 216 lb 12.8 oz (98.3 kg)    BP Readings from Last 3 Encounters:  12/21/22 126/78  09/20/22 126/72  03/08/22 118/70    Assessment  1. Type 2 diabetes mellitus without complication, without long-term current use of insulin (HCC)   2. Combined hyperlipidemia associated with type 2 diabetes mellitus (HCC)   3. Mixed urge and stress incontinence   4. Atypical glandular cells on cervical Pap smear   5. Class 1 obesity due to excess calories with serious comorbidity and body mass index (BMI) of 31.0 to 31.9 in adult      Plan  Diabetes is currently very well controlled.  A1c now normal.  Continue Mounjaro 10 mg  weekly for further diabetic control and weight loss.  Schedule eye exam.  Urine nephropathy screening today.  She remains normotensive.  Continue healthy diet and exercise..  Praised HLD: Should be at goal on Crestor 20.  Check fasting lipids and LFTs today Continue Ditropan  Follow up: 6 months for recheck diabetes Orders Placed This Encounter  Procedures   Microalbumin / creatinine urine ratio   Lipid panel   Comprehensive metabolic panel   POCT HgB A1C   No orders of the defined types were placed in this encounter.     Immunization History  Administered Date(s) Administered   Influenza,inj,Quad PF,6+ Mos 12/24/2021   Influenza-Unspecified 02/07/2021   Moderna Sars-Covid-2 Vaccination 04/22/2019, 05/20/2019   PNEUMOCOCCAL CONJUGATE-20 02/09/2021   Tdap 10/29/2017   Zoster Recombinant(Shingrix) 02/09/2021, 05/17/2021    Diabetes Related Lab Review: Lab Results  Component Value Date   HGBA1C 5.2 12/21/2022   HGBA1C 5.6 09/20/2022   HGBA1C 5.2 03/08/2022    Lab Results  Component Value Date   MICROALBUR <0.7 03/08/2022   Lab Results  Component Value Date   CREATININE 0.75 09/20/2022   BUN 14 09/20/2022   NA 140 09/20/2022   K 4.1 09/20/2022   CL 105 09/20/2022   CO2 27 09/20/2022   Lab Results  Component Value Date   CHOL 180 09/20/2022   CHOL 129 03/08/2022   CHOL 181 09/07/2021   Lab Results  Component Value Date   HDL 42.20 09/20/2022   HDL 38.20 (L) 03/08/2022   HDL 42.60 09/07/2021   Lab Results  Component Value Date   LDLCALC 100 (H) 09/20/2022   LDLCALC 63 03/08/2022   LDLCALC 100 (H) 09/07/2021   Lab Results  Component Value Date   TRIG 187.0 (H) 09/20/2022   TRIG 137.0 03/08/2022   TRIG 191.0 (H) 09/07/2021   Lab Results  Component Value Date   CHOLHDL 4 09/20/2022   CHOLHDL 3 03/08/2022   CHOLHDL 4 09/07/2021   No results found for: "LDLDIRECT" The 10-year ASCVD risk score (Arnett DK, et al., 2019) is: 6.7%   Values used to  calculate the score:     Age: 73 years     Sex: Female     Is Non-Hispanic African American: No     Diabetic: Yes     Tobacco smoker: No     Systolic Blood Pressure: 126 mmHg     Is BP treated: No     HDL Cholesterol: 42.2 mg/dL     Total Cholesterol: 180 mg/dL I have reviewed the PMH, Fam and Soc history. Patient Active Problem List   Diagnosis Date Noted Date Diagnosed   Type 2 diabetes mellitus without complication, without long-term current use of insulin (HCC) 02/09/2021     Priority: High   Combined hyperlipidemia associated with type 2 diabetes mellitus (HCC) 02/09/2021     Priority: High   Subclinical hypothyroidism 10/29/2017     Priority: Medium    History of ectopic pregnancy 10/29/2017     Priority: Low   Atypical glandular cells on cervical Pap smear 09/27/2022     Neg HPV testing: referred to GYN Damaris Hippo, MD: TVUS, colposcopy and endometrial biopsies completed summer 2024    Obesity due to excess calories with serious comorbidity 03/08/2022    Mixed urge and stress incontinence 09/07/2021     Social History: Patient  reports that she has never smoked. She has never used smokeless tobacco. She reports that she does not drink alcohol and does not use drugs.  Review of Systems: Ophthalmic: negative for eye pain, loss of vision or double vision Cardiovascular: negative for chest pain Respiratory: negative for SOB or persistent cough Gastrointestinal: negative for abdominal pain Genitourinary: negative for dysuria or gross hematuria MSK: negative for foot lesions Neurologic: negative for weakness or gait disturbance  Objective  Vitals: BP 126/78   Pulse 62   Temp 97.6 F (36.4 C)   Ht 5\' 8"  (1.727 m)   Wt 209 lb 12.8 oz (95.2 kg)   SpO2 94%   BMI 31.90 kg/m  General: well appearing, no acute distress  Psych:  Alert and oriented, normal mood and affect HEENT:  Normocephalic, atraumatic, moist mucous membranes, supple neck  Cardiovascular:  Nl S1  and S2, RRR without murmur, gallop or rub. no edema Respiratory:  Good breath sounds bilaterally, CTAB with normal effort, no rales   Diabetic education: ongoing education regarding chronic disease management for diabetes was given today. We continue to reinforce the ABC's of diabetic management: A1c (<7 or 8 dependent upon patient), tight blood pressure control, and cholesterol management with goal LDL < 100 minimally. We discuss diet strategies, exercise recommendations, medication options and possible side effects. At each visit, we review recommended immunizations and preventive care recommendations for diabetics and stress that good diabetic control can prevent other problems. See below for this patient's data.   Commons side effects, risks, benefits, and alternatives for  medications and treatment plan prescribed today were discussed, and the patient expressed understanding of the given instructions. Patient is instructed to call or message via MyChart if he/she has any questions or concerns regarding our treatment plan. No barriers to understanding were identified. We discussed Red Flag symptoms and signs in detail. Patient expressed understanding regarding what to do in case of urgent or emergency type symptoms.  Medication list was reconciled, printed and provided to the patient in AVS. Patient instructions and summary information was reviewed with the patient as documented in the AVS. This note was prepared with assistance of Dragon voice recognition software. Occasional wrong-word or sound-a-like substitutions may have occurred due to the inherent limitations of voice recognition software

## 2022-12-22 NOTE — Progress Notes (Signed)
See mychart note Dear Ms. Elizabeth Farley, Your lab results are perfect!!  So glad you are thriving.  Sincerely, Dr. Mardelle Matte

## 2023-01-08 ENCOUNTER — Encounter: Payer: Self-pay | Admitting: Family Medicine

## 2023-01-09 IMAGING — DX DG RIBS W/ CHEST 3+V*L*
5 series · 5 of 5 positions shown · non-contrast
Comparison: Chest x-ray 10/09/2014

CLINICAL DATA: Tenderness along the posterior aspect of the left
sixth rib. Left posterior rib pain, nki, started today

EXAM:
LEFT RIBS AND CHEST - 3+ VIEW

[chest pa]
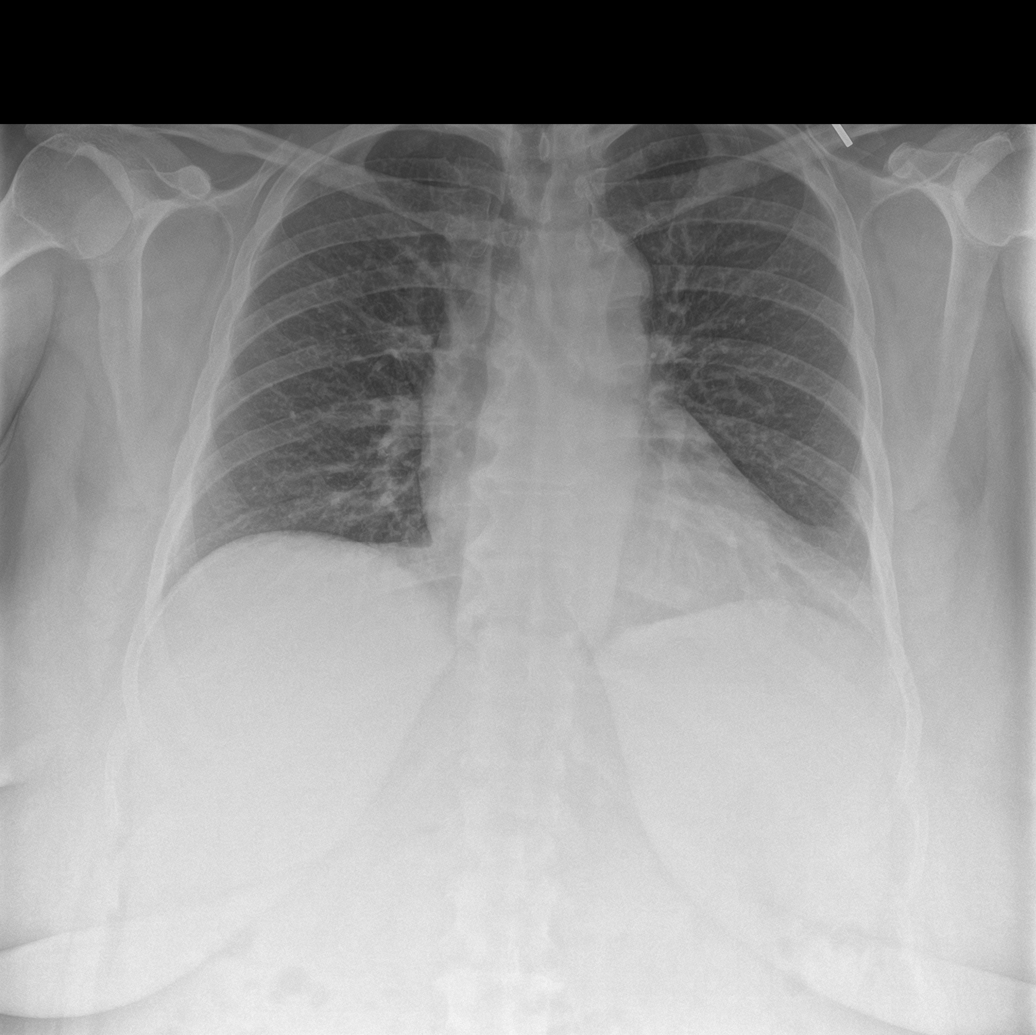

[rib pa obl (1 of 2)]
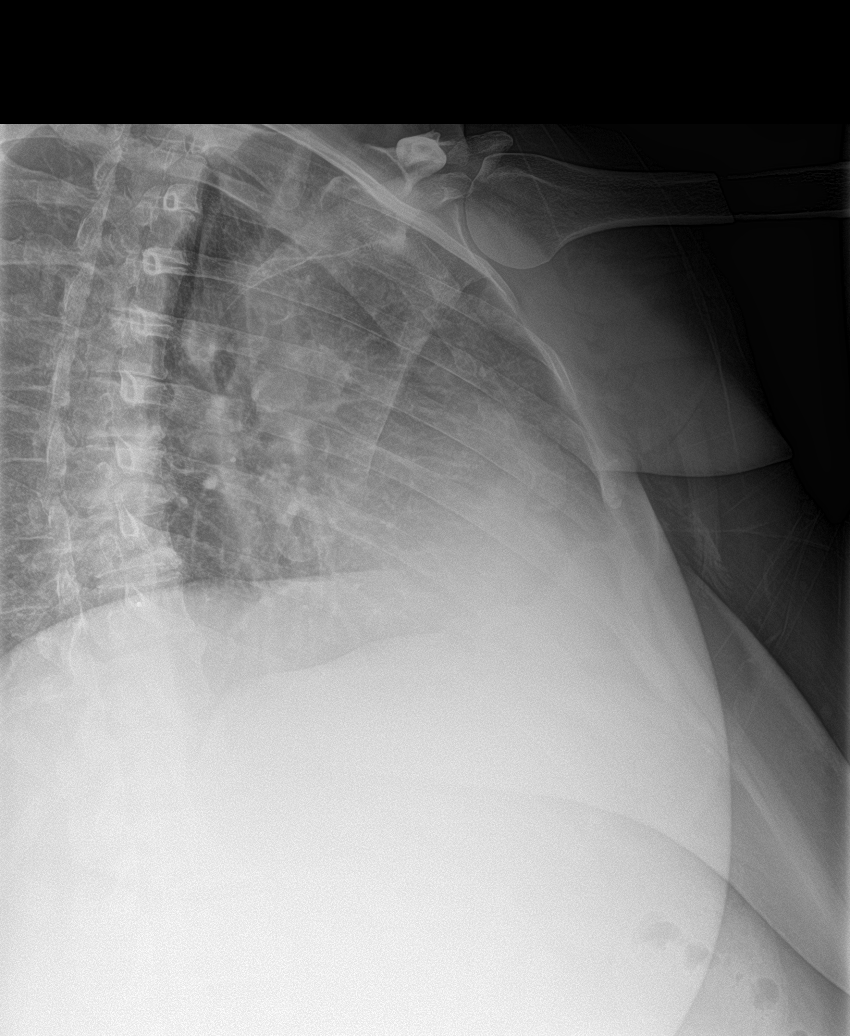

[rib pa obl (2 of 2)]
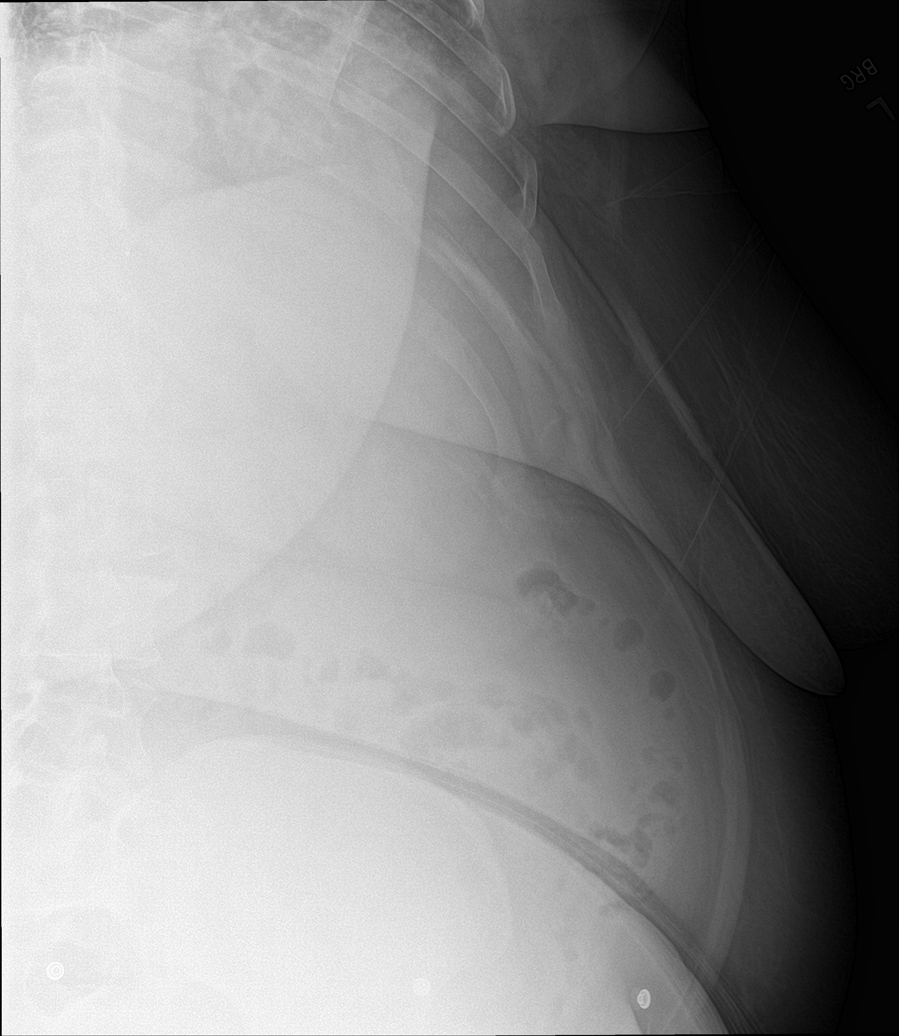

[rib pa (1 of 2)]
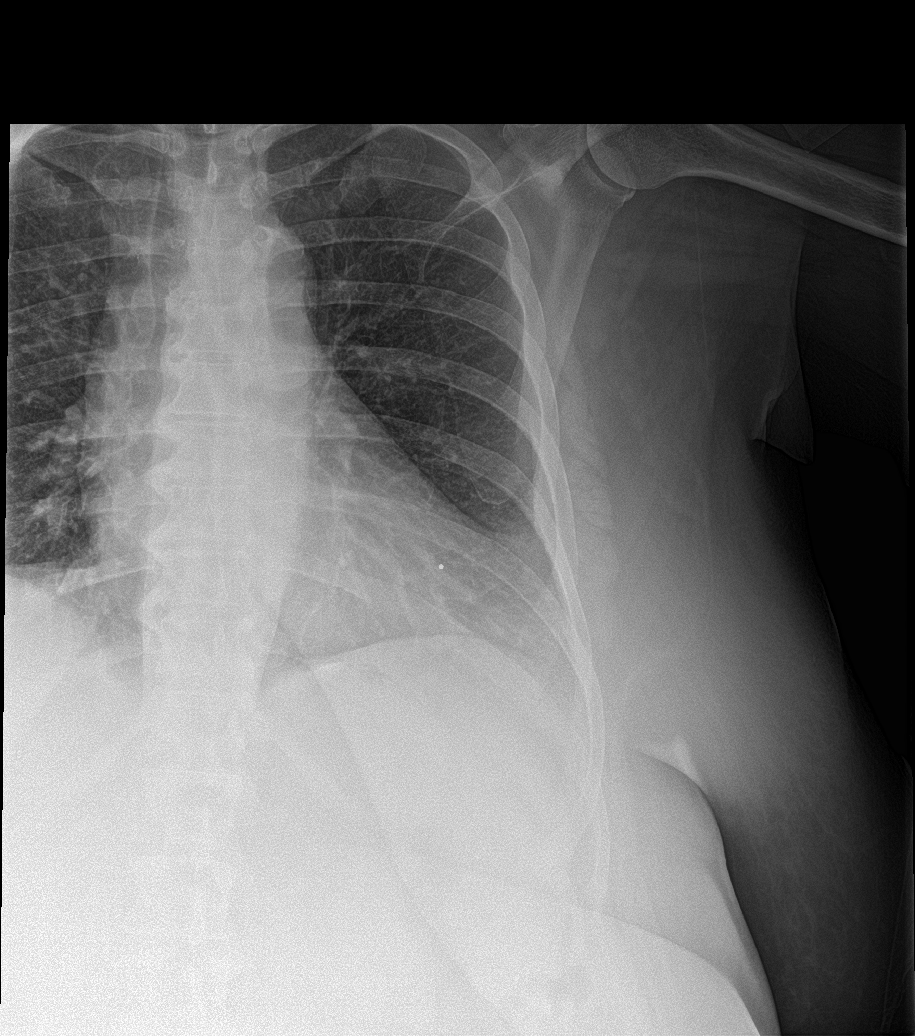

[rib pa (2 of 2)]
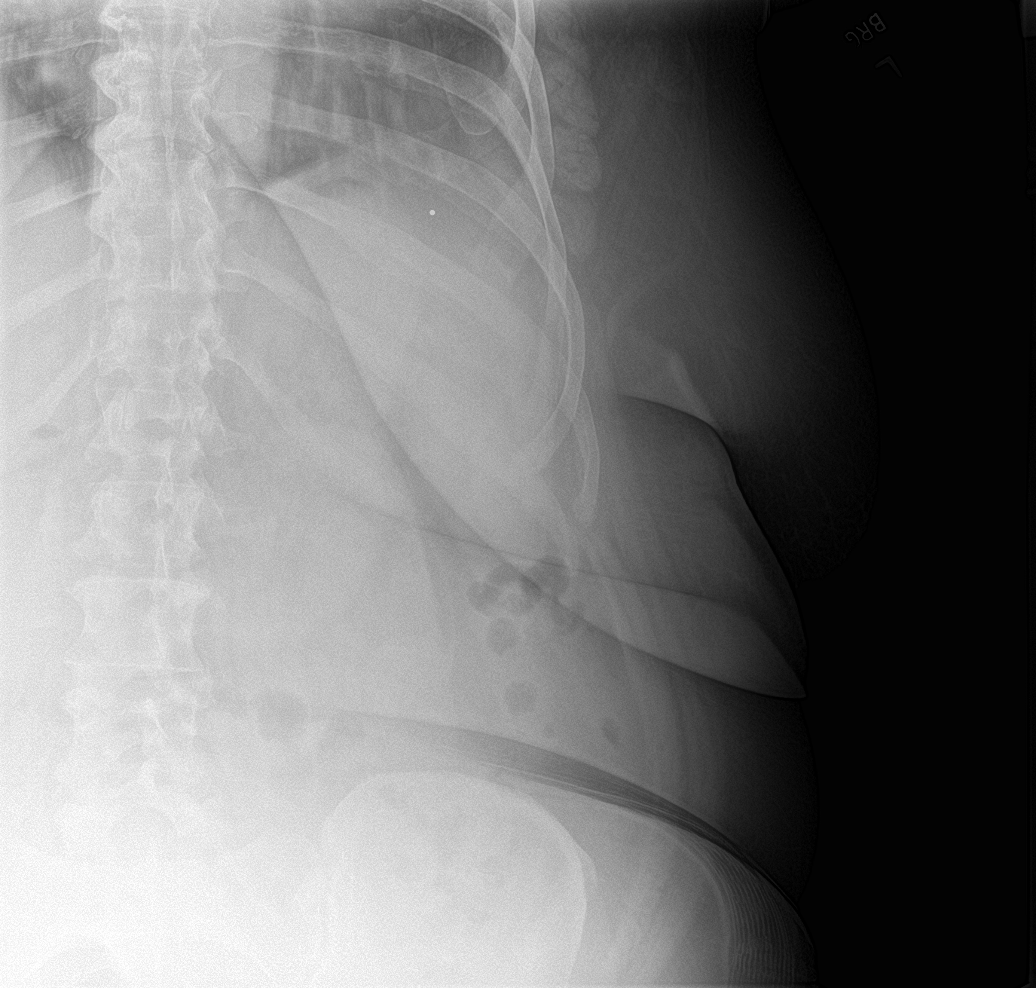

[5 of 5 positions shown; findings below may reference images not displayed]

FINDINGS: Nondisplaced lateral ninth rib fracture. No acute displaced fracture
or other bone lesions are seen involving the left ribs.

The heart and mediastinal contours are within normal limits.

No focal consolidation. No pulmonary edema. No pleural effusion. No
pneumothorax.
IMPRESSION: Nondisplaced lateral ninth rib fracture.

## 2023-01-09 NOTE — Telephone Encounter (Signed)
Please see MyChart message.  Please contact patient and get information to get her sister established with me as a new patient.  Thanks!

## 2023-01-23 LAB — HM DIABETES EYE EXAM

## 2023-02-13 ENCOUNTER — Other Ambulatory Visit (HOSPITAL_COMMUNITY): Payer: Self-pay

## 2023-02-14 ENCOUNTER — Other Ambulatory Visit (HOSPITAL_COMMUNITY): Payer: Self-pay

## 2023-05-17 ENCOUNTER — Other Ambulatory Visit (HOSPITAL_COMMUNITY): Payer: Self-pay

## 2023-06-28 ENCOUNTER — Ambulatory Visit: Payer: Commercial Managed Care - PPO | Admitting: Family Medicine

## 2023-07-30 ENCOUNTER — Ambulatory Visit: Payer: Self-pay | Admitting: Family Medicine

## 2023-07-30 ENCOUNTER — Encounter: Payer: Self-pay | Admitting: Family Medicine

## 2023-07-30 ENCOUNTER — Other Ambulatory Visit: Payer: Self-pay | Admitting: Family Medicine

## 2023-07-30 VITALS — BP 124/78 | HR 64 | Temp 97.7°F | Ht 68.0 in | Wt 207.4 lb

## 2023-07-30 DIAGNOSIS — E66811 Obesity, class 1: Secondary | ICD-10-CM | POA: Diagnosis not present

## 2023-07-30 DIAGNOSIS — Z6831 Body mass index (BMI) 31.0-31.9, adult: Secondary | ICD-10-CM

## 2023-07-30 DIAGNOSIS — Z7985 Long-term (current) use of injectable non-insulin antidiabetic drugs: Secondary | ICD-10-CM | POA: Diagnosis not present

## 2023-07-30 DIAGNOSIS — E6609 Other obesity due to excess calories: Secondary | ICD-10-CM

## 2023-07-30 DIAGNOSIS — E119 Type 2 diabetes mellitus without complications: Secondary | ICD-10-CM

## 2023-07-30 LAB — POCT GLYCOSYLATED HEMOGLOBIN (HGB A1C): Hemoglobin A1C: 5.2 % (ref 4.0–5.6)

## 2023-07-30 LAB — MICROALBUMIN / CREATININE URINE RATIO
Creatinine,U: 95.4 mg/dL
Microalb Creat Ratio: 8.5 mg/g (ref 0.0–30.0)
Microalb, Ur: 0.8 mg/dL (ref 0.0–1.9)

## 2023-07-30 MED ORDER — TIRZEPATIDE 10 MG/0.5ML ~~LOC~~ SOAJ: 10.0000 mg | SUBCUTANEOUS | 6 refills | Status: DC

## 2023-07-30 NOTE — Progress Notes (Signed)
 Subjective  CC:  Chief Complaint  Patient presents with   Diabetes    Mammogram is scheduled for tomorrow.     HPI: Elizabeth Farley is a 63 y.o. female who presents to the office today for follow up of diabetes and problems listed above in the chief complaint.  Diabetes follow up: Her diabetic control is reported as Unchanged. Doing well. She denies exertional CP or SOB or symptomatic hypoglycemia. She denies foot sores or paresthesias. She is using her mounjaro but has had to space it out due to change in insurance. Needs new PA to see if it will be covered. 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 Weight is stable. Reports had eye exam w/o retinopathy in September. Will get me the report.  Wt Readings from Last 3 Encounters:  07/30/23 207 lb 6.4 oz (94.1 kg)  12/21/22 209 lb 12.8 oz (95.2 kg)  09/20/22 219 lb 9.6 oz (99.6 kg)    BP Readings from Last 3 Encounters:  07/30/23 124/78  12/21/22 126/78  09/20/22 126/72    Assessment  1. Type 2 diabetes mellitus without complication, without long-term current use of insulin (HCC)   2. Class 1 obesity due to excess calories with serious comorbidity and body mass index (BMI) of 31.0 to 31.9 in adult      Plan  Diabetes is currently very well controlled. Will reorder mounjaro and start PA. Recommended restarting exercise as able. Pt to get documentation of eye exam for me. She is normotensive and not on ace/arb. Check urine nephropathy screen today.  Obesity: down 60 pounds total. Maintaining now.   Follow up: 3 mo for cpe Orders Placed This Encounter  Procedures   Microalbumin / creatinine urine ratio   POCT HgB A1C   Meds ordered this encounter  Medications   tirzepatide (MOUNJARO) 10 MG/0.5ML Pen    Sig: Inject 10 mg into the skin once a week.    Dispense:  2 mL    Refill:  6    Please start prior authorization due to new insurance; pt has been on this medication for < 12  months and doing well. For diabetes.      Immunization History  Administered Date(s) Administered   Influenza,inj,Quad PF,6+ Mos 12/24/2021   Influenza-Unspecified 02/07/2021   Moderna Sars-Covid-2 Vaccination 04/22/2019, 05/20/2019   PNEUMOCOCCAL CONJUGATE-20 02/09/2021   Tdap 10/29/2017   Zoster Recombinant(Shingrix) 02/09/2021, 05/17/2021    Diabetes Related Lab Review: Lab Results  Component Value Date   HGBA1C 5.2 07/30/2023   HGBA1C 5.2 12/21/2022   HGBA1C 5.6 09/20/2022    Lab Results  Component Value Date   MICROALBUR 0.7 12/21/2022   Lab Results  Component Value Date   CREATININE 0.85 12/21/2022   BUN 12 12/21/2022   NA 138 12/21/2022   K 4.0 12/21/2022   CL 105 12/21/2022   CO2 27 12/21/2022   Lab Results  Component Value Date   CHOL 103 12/21/2022   CHOL 180 09/20/2022   CHOL 129 03/08/2022   Lab Results  Component Value Date   HDL 39.00 (L) 12/21/2022   HDL 42.20 09/20/2022   HDL 38.20 (L) 03/08/2022   Lab Results  Component Value Date   LDLCALC 44 12/21/2022   LDLCALC 100 (H) 09/20/2022   LDLCALC 63 03/08/2022   Lab Results  Component Value Date   TRIG 104.0 12/21/2022   TRIG 187.0 (H) 09/20/2022   TRIG 137.0 03/08/2022  Lab Results  Component Value Date   CHOLHDL 3 12/21/2022   CHOLHDL 4 09/20/2022   CHOLHDL 3 03/08/2022   No results found for: "LDLDIRECT" The ASCVD Risk score (Arnett DK, et al., 2019) failed to calculate for the following reasons:   The valid total cholesterol range is 130 to 320 mg/dL I have reviewed the PMH, Fam and Soc history. Patient Active Problem List   Diagnosis Date Noted Date Diagnosed   Type 2 diabetes mellitus without complication, without long-term current use of insulin (HCC) 02/09/2021     Priority: High   Combined hyperlipidemia associated with type 2 diabetes mellitus (HCC) 02/09/2021     Priority: High   Subclinical hypothyroidism 10/29/2017     Priority: Medium    History of ectopic  pregnancy 10/29/2017     Priority: Low   Atypical glandular cells on cervical Pap smear 09/27/2022     Neg HPV testing: referred to GYN Damaris Hippo, MD: TVUS, colposcopy and endometrial biopsies completed summer 2024    Obesity due to excess calories with serious comorbidity 03/08/2022    Mixed urge and stress incontinence 09/07/2021     Social History: Patient  reports that she has never smoked. She has never used smokeless tobacco. She reports that she does not drink alcohol and does not use drugs.  Review of Systems: Ophthalmic: negative for eye pain, loss of vision or double vision Cardiovascular: negative for chest pain Respiratory: negative for SOB or persistent cough Gastrointestinal: negative for abdominal pain Genitourinary: negative for dysuria or gross hematuria MSK: negative for foot lesions Neurologic: negative for weakness or gait disturbance  Objective  Vitals: BP 124/78   Pulse 64   Temp 97.7 F (36.5 C)   Ht 5\' 8"  (1.727 m)   Wt 207 lb 6.4 oz (94.1 kg)   SpO2 96%   BMI 31.54 kg/m  General: well appearing, no acute distress  Psych:  Alert and oriented, normal mood and affect HEENT:  Normocephalic, atraumatic, moist mucous membranes, supple neck  Cardiovascular:  Nl S1 and S2, RRR without murmur, gallop or rub. no edema Respiratory:  Good breath sounds bilaterally, CTAB with normal effort, no rales Gastrointestinal: normal BS, soft, nontender Skin:  Warm, no rashes Neurologic:   Mental status is normal. normal gait Foot exam: no erythema, pallor, or cyanosis visible nl proprioception and sensation to monofilament testing bilaterally, +2 distal pulses bilaterally    Diabetic education: ongoing education regarding chronic disease management for diabetes was given today. We continue to reinforce the ABC's of diabetic management: A1c (<7 or 8 dependent upon patient), tight blood pressure control, and cholesterol management with goal LDL < 100 minimally. We  discuss diet strategies, exercise recommendations, medication options and possible side effects. At each visit, we review recommended immunizations and preventive care recommendations for diabetics and stress that good diabetic control can prevent other problems. See below for this patient's data.   Commons side effects, risks, benefits, and alternatives for medications and treatment plan prescribed today were discussed, and the patient expressed understanding of the given instructions. Patient is instructed to call or message via MyChart if he/she has any questions or concerns regarding our treatment plan. No barriers to understanding were identified. We discussed Red Flag symptoms and signs in detail. Patient expressed understanding regarding what to do in case of urgent or emergency type symptoms.  Medication list was reconciled, printed and provided to the patient in AVS. Patient instructions and summary information was reviewed with the patient as  documented in the AVS. This note was prepared with assistance of Dragon voice recognition software. Occasional wrong-word or sound-a-like substitutions may have occurred due to the inherent limitations of voice recognition software

## 2023-07-30 NOTE — Patient Instructions (Signed)
Please return in 3 months for your annual complete physical; please come fasting.   If you have any questions or concerns, please don't hesitate to send me a message via MyChart or call the office at [phone]. Thank you for visiting with us today! It's our pleasure caring for you.  

## 2023-07-31 ENCOUNTER — Encounter: Payer: Self-pay | Admitting: Family Medicine

## 2023-07-31 NOTE — Progress Notes (Signed)
 See mychart note Dear Ms. Boss, Your urine test is normal. This is good news.  And your insurance is requesting a change from mounjaro to Trulicity: I am asking for a prior authorization to see if they will cover it since you have been on it already. If not, I will change to trulicity.  Sincerely, Dr. Mardelle Matte

## 2023-08-01 ENCOUNTER — Encounter: Payer: Self-pay | Admitting: Family Medicine

## 2023-08-07 ENCOUNTER — Other Ambulatory Visit (HOSPITAL_COMMUNITY): Payer: Self-pay

## 2023-08-07 DIAGNOSIS — Z1231 Encounter for screening mammogram for malignant neoplasm of breast: Secondary | ICD-10-CM | POA: Diagnosis not present

## 2023-08-07 LAB — HM MAMMOGRAPHY

## 2023-08-08 ENCOUNTER — Other Ambulatory Visit (HOSPITAL_COMMUNITY): Payer: Self-pay

## 2023-08-08 ENCOUNTER — Telehealth: Payer: Self-pay

## 2023-08-08 NOTE — Telephone Encounter (Signed)
 PA request has been Denied. New Encounter has been or will be created for follow up. For additional info see Pharmacy Prior Auth telephone encounter from 08/08/2023.

## 2023-08-08 NOTE — Telephone Encounter (Signed)
 Pharmacy Patient Advocate Encounter   Received notification from RX Request Messages that prior authorization for Mounjaro 10MG /0.5ML auto-injectors is required/requested.   Insurance verification completed.   The patient is insured through [employer] .   Per test claim: PA required; PA submitted to above mentioned insurance via CoverMyMeds Key/confirmation #/EOC [id number] Status is pending

## 2023-08-08 NOTE — Telephone Encounter (Signed)
 Pharmacy Patient Advocate Encounter  Received notification from [employer] that Prior Authorization for MOUNJARO 10MG /0.5ML has been DENIED.  Full denial letter will be uploaded to the media tab. See denial reason below.   PA #/Case ID/Reference #: [id number]

## 2023-08-09 NOTE — Telephone Encounter (Signed)
 Please see below notes and sign off on encounter. PA has already been submitted and denied. Thank you.

## 2023-08-09 NOTE — Telephone Encounter (Signed)
 See note below, PA has been denied and documented in separate encounter, please sign off on rx in this encounter as PA team is unable to resolve RX requests. Thank you

## 2023-08-16 ENCOUNTER — Telehealth: Payer: Self-pay

## 2023-08-16 ENCOUNTER — Other Ambulatory Visit (HOSPITAL_COMMUNITY): Payer: Self-pay

## 2023-08-16 NOTE — Telephone Encounter (Signed)
 Pharmacy Patient Advocate Encounter  Received notification from [facility] that Prior Authorization for Trulicity 0.75MG /0.5ML auto-injectors has been APPROVED from 08/16/23 to 08/15/24. Ran test claim, Copay is $25. This test claim was processed through [facility]- copay amounts may vary at other pharmacies due to pharmacy/plan contracts, or as the patient moves through the different stages of their insurance plan.   PA #/Case ID/Reference #: [id number]

## 2023-10-30 ENCOUNTER — Encounter: Payer: Self-pay | Admitting: Family Medicine

## 2023-10-30 ENCOUNTER — Ambulatory Visit (INDEPENDENT_AMBULATORY_CARE_PROVIDER_SITE_OTHER): Admitting: Family Medicine

## 2023-10-30 VITALS — BP 129/84 | HR 65 | Temp 98.2°F | Ht 68.0 in | Wt 212.6 lb

## 2023-10-30 DIAGNOSIS — E66811 Obesity, class 1: Secondary | ICD-10-CM

## 2023-10-30 DIAGNOSIS — E782 Mixed hyperlipidemia: Secondary | ICD-10-CM | POA: Diagnosis not present

## 2023-10-30 DIAGNOSIS — E038 Other specified hypothyroidism: Secondary | ICD-10-CM | POA: Diagnosis not present

## 2023-10-30 DIAGNOSIS — E6609 Other obesity due to excess calories: Secondary | ICD-10-CM

## 2023-10-30 DIAGNOSIS — D126 Benign neoplasm of colon, unspecified: Secondary | ICD-10-CM | POA: Insufficient documentation

## 2023-10-30 DIAGNOSIS — F4321 Adjustment disorder with depressed mood: Secondary | ICD-10-CM | POA: Diagnosis not present

## 2023-10-30 DIAGNOSIS — Z6831 Body mass index (BMI) 31.0-31.9, adult: Secondary | ICD-10-CM | POA: Diagnosis not present

## 2023-10-30 DIAGNOSIS — R87619 Unspecified abnormal cytological findings in specimens from cervix uteri: Secondary | ICD-10-CM

## 2023-10-30 DIAGNOSIS — N3946 Mixed incontinence: Secondary | ICD-10-CM

## 2023-10-30 DIAGNOSIS — Z0001 Encounter for general adult medical examination with abnormal findings: Secondary | ICD-10-CM | POA: Diagnosis not present

## 2023-10-30 DIAGNOSIS — E119 Type 2 diabetes mellitus without complications: Secondary | ICD-10-CM

## 2023-10-30 DIAGNOSIS — Z7985 Long-term (current) use of injectable non-insulin antidiabetic drugs: Secondary | ICD-10-CM

## 2023-10-30 DIAGNOSIS — E1169 Type 2 diabetes mellitus with other specified complication: Secondary | ICD-10-CM | POA: Diagnosis not present

## 2023-10-30 LAB — LIPID PANEL
Cholesterol: 199 mg/dL (ref 0–200)
HDL: 47.2 mg/dL (ref >39.00)
LDL Cholesterol: 122 mg/dL — ABNORMAL HIGH (ref 0–99)
NonHDL: 151.57
Total CHOL/HDL Ratio: 4
Triglycerides: 148 mg/dL (ref 0.0–149.0)
VLDL: 29.6 mg/dL (ref 0.0–40.0)

## 2023-10-30 LAB — COMPREHENSIVE METABOLIC PANEL WITH GFR
ALT: 17 U/L (ref 0–35)
AST: 20 U/L (ref 0–37)
Albumin: 4.2 g/dL (ref 3.5–5.2)
Alkaline Phosphatase: 49 U/L (ref 39–117)
BUN: 10 mg/dL (ref 6–23)
CO2: 30 meq/L (ref 19–32)
Calcium: 9.3 mg/dL (ref 8.4–10.5)
Chloride: 102 meq/L (ref 96–112)
Creatinine, Ser: 0.75 mg/dL (ref 0.40–1.20)
GFR: 86.05 mL/min (ref >60.00)
Glucose, Bld: 88 mg/dL (ref 70–99)
Potassium: 4 meq/L (ref 3.5–5.1)
Sodium: 139 meq/L (ref 135–145)
Total Bilirubin: 0.4 mg/dL (ref 0.2–1.2)
Total Protein: 7.2 g/dL (ref 6.0–8.3)

## 2023-10-30 LAB — CBC WITH DIFFERENTIAL/PLATELET
Basophils Absolute: 0 K/uL (ref 0.0–0.1)
Basophils Relative: 0.8 % (ref 0.0–3.0)
Eosinophils Absolute: 0.2 K/uL (ref 0.0–0.7)
Eosinophils Relative: 3.1 % (ref 0.0–5.0)
HCT: 40.5 % (ref 36.0–46.0)
Hemoglobin: 13.3 g/dL (ref 12.0–15.0)
Lymphocytes Relative: 26.5 % (ref 12.0–46.0)
Lymphs Abs: 1.6 K/uL (ref 0.7–4.0)
MCHC: 32.7 g/dL (ref 30.0–36.0)
MCV: 85.4 fl (ref 78.0–100.0)
Monocytes Absolute: 0.4 K/uL (ref 0.1–1.0)
Monocytes Relative: 6.6 % (ref 3.0–12.0)
Neutro Abs: 3.8 K/uL (ref 1.4–7.7)
Neutrophils Relative %: 63 % (ref 43.0–77.0)
Platelets: 254 K/uL (ref 150.0–400.0)
RBC: 4.74 Mil/uL (ref 3.87–5.11)
RDW: 14.3 % (ref 11.5–15.5)
WBC: 6 K/uL (ref 4.0–10.5)

## 2023-10-30 LAB — T4, FREE: Free T4: 0.74 ng/dL (ref 0.60–1.60)

## 2023-10-30 LAB — HEMOGLOBIN A1C: Hgb A1c MFr Bld: 5.9 % (ref 4.6–6.5)

## 2023-10-30 LAB — TSH: TSH: 5.91 u[IU]/mL — ABNORMAL HIGH (ref 0.35–5.50)

## 2023-10-30 MED ORDER — TRULICITY 0.75 MG/0.5ML ~~LOC~~ SOAJ: 0.7500 mg | SUBCUTANEOUS | 2 refills | Status: DC

## 2023-10-30 NOTE — Patient Instructions (Addendum)
 Please return in 6 months to recheck diabetes and blood pressure   I will release your lab results to you on your MyChart account with further instructions. You may see the results before I do, but when I review them I will send you a message with my report or have my assistant call you if things need to be discussed. Please reply to my message with any questions. Thank you!   If you have any questions or concerns, please don't hesitate to send me a message via MyChart or call the office at [phone]. Thank you for visiting with us  today! It's our pleasure caring for you.    VISIT SUMMARY: Today, we discussed your diabetes management, cholesterol levels, and general health maintenance. We reviewed your current health status and made plans to address your concerns and improve your overall well-being.  YOUR PLAN: -TYPE 2 DIABETES MELLITUS: Type 2 diabetes is a condition where your body does not use insulin properly, leading to high blood sugar levels. We will restart Trulicity , a medication that helps control blood sugar, through [facility]. Begin with the starting dose and adjust as needed based on your response. We will also conduct blood work to monitor your diabetes status.  -HYPERLIPIDEMIA: Hyperlipidemia means having high levels of cholesterol in your blood, which can increase the risk of heart disease. It is important to restart your statin medication at the previous dose to manage your cholesterol levels effectively.  -GENERAL HEALTH MAINTENANCE: Routine health screenings are important to catch any potential issues early. You are due for a colonoscopy, Pap smear, and eye exam. Given your history of polyps, it is crucial to schedule your colonoscopy in November. Please also schedule your Pap smear and eye examination.  INSTRUCTIONS: Please follow up with scheduling your colonoscopy in November, as well as your Pap smear and eye examination. Ensure you obtain your Trulicity  medication through [facility]  and restart your statin at the previous dose. We will conduct blood work to monitor your diabetes status.                      Contains text generated by [employer]bridge.                                 Contains text generated by [employer]bridge.

## 2023-10-30 NOTE — Progress Notes (Signed)
 Subjective  Chief Complaint  Patient presents with   Annual Exam    Pt here for Annual exam and is currently fasting   Diabetes    HPI: Elizabeth Farley is a 63 y.o. female who presents to [facility] [place] today for a Female Wellness Visit. She also has the concerns and/or needs as listed above in the chief complaint. These will be addressed in addition to the Health Maintenance Visit.   Wellness Visit: annual visit with health maintenance review and exam  HM: due pap smear. Colonoscopy due in November due to h/o tubular adenoma 2022. Mammo current.  Mother passed June 18th: Patient had been taking care of her since April.  She is grieving appropriately.  However she does admit that her diet and medication compliance had suffered while she was caring for her mother.  Has not been on her statin for several months.  Never started the Trulicity .  Has gained some weight back. Chronic disease f/u and/or acute problem visit: (deemed necessary to be done in addition to the wellness visit): Type 2 diabetes: Due for recheck.  No symptoms of hyperglycemia.  No retinopathy or neuropathy.  Is willing to restart GLP-1.  Trulicity  is the current covered product through her insurance.  She is working on getting back on healthy diet and restarting her exercise. Hyperlipidemia: Had been on Crestor  20 mg nightly but has stopped over the last 3 months.  She is fasting today for recheck.  Was willing to restart.  No side effects.  To start low-fat diet. Had atypical glandular cells on Pap smear 2024, colposcopy and endometrial biopsy were negative.  Due for repeat Pap now.  She will schedule with GYN Incontinence: Had been on Ditropan  but she has stopped this as well.  No new symptoms.  Assessment  1. Encounter for well adult exam with abnormal findings   2. Type 2 diabetes mellitus without complication, without long-term current use of insulin (HCC)   3. Combined hyperlipidemia associated  with type 2 diabetes mellitus (HCC)   4. Subclinical hypothyroidism   5. Atypical glandular cells on cervical Pap smear   6. Mixed urge and stress incontinence   7. Class 1 obesity due to excess calories with serious comorbidity and body mass index (BMI) of 31.0 to 31.9 in adult   8. Tubular adenoma of colon   9. Grief      Plan  Female Wellness Visit: Age appropriate Health Maintenance and Prevention measures were discussed with patient. Included topics are cancer screening recommendations, ways to keep healthy (see AVS) including dietary and exercise recommendations, regular eye and dental care, use of seat belts, and avoidance of moderate alcohol use and tobacco use.  Patient to schedule Pap smear.  Colonoscopy due in November, she will be on the look out for the letter.  Mammogram current BMI: discussed patient's BMI and encouraged positive lifestyle modifications to help get to or maintain a target BMI. HM needs and immunizations were addressed and ordered. See below for orders. See HM and immunization section for updates. Routine labs and screening tests ordered including cmp, cbc and lipids where appropriate. Discussed recommendations regarding Vit D and calcium  supplementation (see AVS)  Chronic disease management visit and/or acute problem visit: Diabetes: Check A1c today.  Had been very well-controlled on Mounjaro .  Will restart Trulicity  at 0.75 mg weekly and get back on diet and exercise.  Recheck 6 months Hyperlipidemia: Restart Crestor  20.  Check fasting lipid panel  and LFTs today.  Likely LDL will be above goal.  Will recheck in 6 months after she is on her medication again.  Low-fat diet recommended Monitor home blood pressures, diastolic is mildly elevated today.  Likely grief reaction Recheck thyroid  function.  Clinically euthyroid Obesity: Trulicity  and diet and exercise recommended Follow up: 6 months for recheck Orders Placed This Encounter  Procedures   CBC with  Differential/Platelet   Comprehensive metabolic panel with GFR   Lipid panel   Hemoglobin A1c   TSH   T4, free   T3   Meds ordered this encounter  Medications   Dulaglutide  (TRULICITY ) 0.75 MG/0.5ML SOAJ    Sig: Inject 0.75 mg into the skin once a week.    Dispense:  2 mL    Refill:  2      Body mass index is 32.33 kg/m. Wt Readings from Last 3 Encounters:  10/30/23 212 lb 9.6 oz (96.4 kg)  07/30/23 207 lb 6.4 oz (94.1 kg)  12/21/22 209 lb 12.8 oz (95.2 kg)     Patient Active Problem List   Diagnosis Date Noted   Tubular adenoma of colon 10/30/2023    Priority: High    Colonoscopy 02/2021, Dr. Barbara, repeat in 3 years.    Obesity due to excess calories with serious comorbidity 03/08/2022    Priority: High   Type 2 diabetes mellitus without complication, without long-term current use of insulin (HCC) 02/09/2021    Priority: High   Combined hyperlipidemia associated with type 2 diabetes mellitus (HCC) 02/09/2021    Priority: High   Atypical glandular cells on cervical Pap smear 09/27/2022    Priority: Medium     Neg HPV testing: referred to GYN Evalene Smiles, MD: TVUS, colposcopy and endometrial biopsies completed summer 2024    Mixed urge and stress incontinence 09/07/2021    Priority: Medium    Subclinical hypothyroidism 10/29/2017    Priority: Medium    History of ectopic pregnancy 10/29/2017    Priority: Low   Health Maintenance  Topic Date Due   COVID-19 Vaccine (3 - 2024-25 season) 11/15/2023 (Originally 12/24/2022)   INFLUENZA VACCINE  11/23/2023   Diabetic kidney evaluation - eGFR measurement  12/21/2023   OPHTHALMOLOGY EXAM  01/23/2024   HEMOGLOBIN A1C  01/29/2024   Colonoscopy  03/09/2024   Diabetic kidney evaluation - Urine ACR  07/29/2024   FOOT EXAM  07/29/2024   MAMMOGRAM  08/06/2024   Cervical Cancer Screening (HPV/Pap Cotest)  09/20/2027   DTaP/Tdap/Td (2 - Td or Tdap) 10/30/2027   Pneumococcal Vaccine 78-23 Years old  Completed    Zoster Vaccines- Shingrix   Completed   Hepatitis B Vaccines  Aged Out   HPV VACCINES  Aged Out   Meningococcal B Vaccine  Aged Out   Hepatitis C Screening  Discontinued   HIV Screening  Discontinued   Immunization History  Administered Date(s) Administered   Influenza,inj,Quad PF,6+ Mos 12/24/2021   Influenza-Unspecified 02/07/2021   Moderna Sars-Covid-2 Vaccination 04/22/2019, 05/20/2019   PNEUMOCOCCAL CONJUGATE-20 02/09/2021   Tdap 10/29/2017   Zoster Recombinant(Shingrix ) 02/09/2021, 05/17/2021   We updated and reviewed the patient's past history in detail and it is documented below. Allergies: Patient has no known allergies. Past Medical History Patient  has a past medical history of Acquired hypothyroidism (10/29/2017), Diabetes mellitus without complication (HCC), Ectopic pregnancy, and Thyroid  disease. Past Surgical History Patient  has a past surgical history that includes Nose surgery; Tubal ligation; Pelvic laparoscopy; Dilatation & curettage/hysteroscopy with myosure (N/A, 08/18/2014); and  Rhinoplasty. Family History: Patient family history includes Atrial fibrillation in her mother; Cancer in her paternal grandfather; Colon cancer in her maternal uncle; Colon polyps in her brother; Diabetes in her father and mother; Heart disease in her father; Hyperlipidemia in her father and mother. Social History:  Patient  reports that she has never smoked. She has never used smokeless tobacco. She reports that she does not drink alcohol and does not use drugs.  Review of Systems: Constitutional: negative for fever or malaise Ophthalmic: negative for photophobia, double vision or loss of vision Cardiovascular: negative for chest pain, dyspnea on exertion, or new LE swelling Respiratory: negative for SOB or persistent cough Gastrointestinal: negative for abdominal pain, change in bowel habits or melena Genitourinary: negative for dysuria or gross hematuria, no abnormal uterine bleeding  or disharge Musculoskeletal: negative for new gait disturbance or muscular weakness Integumentary: negative for new or persistent rashes, no breast lumps Neurological: negative for TIA or stroke symptoms Psychiatric: negative for SI or delusions Allergic/Immunologic: negative for hives  Patient Care Team    Relationship Specialty Notifications Start End  Jodie Lavern CROME, MD PCP - General Family Medicine  10/29/17   Lequita Evalene LABOR, MD Consulting Physician Obstetrics and Gynecology  12/21/22     Objective  Vitals: BP 129/84   Pulse 65   Temp 98.2 F (36.8 C)   Ht 5' 8 (1.727 m)   Wt 212 lb 9.6 oz (96.4 kg)   SpO2 92%   BMI 32.33 kg/m  General:  Well developed, well nourished, no acute distress  Psych:  Alert and orientedx3,normal mood and affect HEENT:  Normocephalic, atraumatic, non-icteric sclera,  supple neck without adenopathy, mass or thyromegaly Cardiovascular:  Normal S1, S2, RRR without gallop, rub or murmur Respiratory:  Good breath sounds bilaterally, CTAB with normal respiratory effort Gastrointestinal: normal bowel sounds, soft, non-tender, no noted masses. No HSM MSK: extremities without edema, joints without erythema or swelling Neurologic:    Mental status is normal.  Gross motor and sensory exams are normal.  No tremor Diabetic Foot Exam: Appearance - no lesions, ulcers or significant calluses Skin - no sigificant pallor or erythema Normal sensation Pulses - +2 distally bilaterally   Commons side effects, risks, benefits, and alternatives for medications and treatment plan prescribed today were discussed, and the patient expressed understanding of the given instructions. Patient is instructed to call or message via MyChart if he/she has any questions or concerns regarding our treatment plan. No barriers to understanding were identified. We discussed Red Flag symptoms and signs in detail. Patient expressed understanding regarding what to do in case of urgent or  emergency type symptoms.  Medication list was reconciled, printed and provided to the patient in AVS. Patient instructions and summary information was reviewed with the patient as documented in the AVS. This note was prepared with assistance of Dragon voice recognition software. Occasional wrong-word or sound-a-like substitutions may have occurred due to the inherent limitations of voice recognition software

## 2023-10-31 ENCOUNTER — Ambulatory Visit: Payer: Self-pay | Admitting: Family Medicine

## 2023-10-31 LAB — T3: T3, Total: 138 ng/dL (ref 76–181)

## 2023-10-31 NOTE — Progress Notes (Signed)
 See mychart note Dear Ms. Debbi, Your lab results show that your cholesterol levels are up as expected.  Please restart your statin nightly so that we can get this under control again.  Diabetes is doing well.  Please restart Trulicity  so we can work on your weight loss.  Also, your thyroid  levels are borderline low.  I will monitor this 40 over time.  We may need to start thyroid  medication at some point.  And finally, your vitamin D  level is just below normal.  Please restart vitamin D  over-the-counter 4000 units daily.  Get back on track.  I know you can do it.  I will see you next time. Sincerely, Dr. Jodie

## 2023-11-01 ENCOUNTER — Other Ambulatory Visit (HOSPITAL_COMMUNITY): Payer: Self-pay

## 2024-03-04 ENCOUNTER — Encounter: Payer: Self-pay | Admitting: Gastroenterology

## 2024-03-10 DIAGNOSIS — Z01419 Encounter for gynecological examination (general) (routine) without abnormal findings: Secondary | ICD-10-CM | POA: Diagnosis not present

## 2024-03-10 DIAGNOSIS — Z1151 Encounter for screening for human papillomavirus (HPV): Secondary | ICD-10-CM | POA: Diagnosis not present

## 2024-03-10 DIAGNOSIS — N841 Polyp of cervix uteri: Secondary | ICD-10-CM | POA: Diagnosis not present

## 2024-03-10 DIAGNOSIS — Z124 Encounter for screening for malignant neoplasm of cervix: Secondary | ICD-10-CM | POA: Diagnosis not present

## 2024-03-10 DIAGNOSIS — Z6833 Body mass index (BMI) 33.0-33.9, adult: Secondary | ICD-10-CM | POA: Diagnosis not present

## 2024-04-01 ENCOUNTER — Ambulatory Visit: Admitting: *Deleted

## 2024-04-01 VITALS — Ht 68.0 in | Wt 214.0 lb

## 2024-04-01 DIAGNOSIS — Z860100 Personal history of colon polyps, unspecified: Secondary | ICD-10-CM

## 2024-04-01 MED ORDER — NA SULFATE-K SULFATE-MG SULF 17.5-3.13-1.6 GM/177ML PO SOLN: 1.0000 | Freq: Once | ORAL | 0 refills | Status: AC

## 2024-04-01 NOTE — Progress Notes (Signed)
 Pt's name and DOB verified at the beginning of the pre-visit with 2 identifiers  Permission given to speak with  Pt denies any difficulty with ambulating,sitting, laying down or rolling side to side  Pt has no issues moving head neck or swallowing  No egg or soy allergy known to patient   No issues known to pt with past sedation  No FH of Malignant Hyperthermia  Pt is not on home 02   Pt is not on blood thinners   Pt denies issues with constipation   Pt is not on dialysis  Pt denise any abnormal heart rhythms   Pt denies any upcoming cardiac testing  Patient's chart reviewed by Norleen Schillings CNRA prior to pre-visit and patient appropriate for the [facility].  Pre-visit completed and red dot placed by patient's name on their procedure day (on provider's schedule).    Chart not reviewed by CRNA prior to [facility]  Visit in person  Pt states weight is 214 lb   Pt given  both [facility] main # and MD on call # prior to instructions.  Informed pt to come in at the time discussed and is shown on PV instructions.  Pt instructed to use Single[employer].com or GoodRx for a price reduction on prep  Instructed pt where to find PV instructions in My Chart. . Instructed pt on all aspects of written instructions including med holds clothing to wear and foods to eat and not eat as well as after procedure legal restrictions and to call MD on call if needed.. Pt states understanding. Instructed pt to review instructions again prior to procedure and call main # given if has any questions or any issues. Pt states they will.

## 2024-04-09 ENCOUNTER — Encounter: Payer: Self-pay | Admitting: Gastroenterology

## 2024-04-10 ENCOUNTER — Telehealth: Payer: Self-pay | Admitting: Gastroenterology

## 2024-04-10 NOTE — Telephone Encounter (Signed)
 Good morning Dr. Nandigam,   I received a call from this patient stating that she is needing to cancel her colonoscopy due to her insurance not paying for her procedure that was scheduled for December the 23 rd. Please advise.    Thank you.

## 2024-04-15 ENCOUNTER — Encounter: Admitting: Gastroenterology

## 2024-05-01 ENCOUNTER — Other Ambulatory Visit (HOSPITAL_COMMUNITY): Payer: Self-pay

## 2024-05-01 ENCOUNTER — Encounter (HOSPITAL_COMMUNITY): Payer: Self-pay | Admitting: Pharmacist

## 2024-05-01 ENCOUNTER — Other Ambulatory Visit: Payer: Self-pay

## 2024-05-01 ENCOUNTER — Encounter: Payer: Self-pay | Admitting: Family Medicine

## 2024-05-01 ENCOUNTER — Ambulatory Visit: Admitting: Family Medicine

## 2024-05-01 VITALS — BP 131/87 | HR 72 | Temp 97.7°F | Ht 68.0 in | Wt 218.0 lb

## 2024-05-01 DIAGNOSIS — E119 Type 2 diabetes mellitus without complications: Secondary | ICD-10-CM | POA: Diagnosis not present

## 2024-05-01 DIAGNOSIS — E782 Mixed hyperlipidemia: Secondary | ICD-10-CM | POA: Diagnosis not present

## 2024-05-01 DIAGNOSIS — D126 Benign neoplasm of colon, unspecified: Secondary | ICD-10-CM

## 2024-05-01 DIAGNOSIS — E6609 Other obesity due to excess calories: Secondary | ICD-10-CM

## 2024-05-01 DIAGNOSIS — E559 Vitamin D deficiency, unspecified: Secondary | ICD-10-CM | POA: Insufficient documentation

## 2024-05-01 DIAGNOSIS — E1169 Type 2 diabetes mellitus with other specified complication: Secondary | ICD-10-CM

## 2024-05-01 DIAGNOSIS — E038 Other specified hypothyroidism: Secondary | ICD-10-CM | POA: Diagnosis not present

## 2024-05-01 DIAGNOSIS — R030 Elevated blood-pressure reading, without diagnosis of hypertension: Secondary | ICD-10-CM | POA: Diagnosis not present

## 2024-05-01 DIAGNOSIS — E66811 Obesity, class 1: Secondary | ICD-10-CM

## 2024-05-01 DIAGNOSIS — Z7985 Long-term (current) use of injectable non-insulin antidiabetic drugs: Secondary | ICD-10-CM | POA: Diagnosis not present

## 2024-05-01 DIAGNOSIS — Z6833 Body mass index (BMI) 33.0-33.9, adult: Secondary | ICD-10-CM | POA: Diagnosis not present

## 2024-05-01 LAB — CBC WITH DIFFERENTIAL/PLATELET
Basophils Absolute: 0 K/uL (ref 0.0–0.1)
Basophils Relative: 0.8 % (ref 0.0–3.0)
Eosinophils Absolute: 0.2 K/uL (ref 0.0–0.7)
Eosinophils Relative: 3 % (ref 0.0–5.0)
HCT: 44.9 % (ref 36.0–46.0)
Hemoglobin: 14.6 g/dL (ref 12.0–15.0)
Lymphocytes Relative: 27 % (ref 12.0–46.0)
Lymphs Abs: 1.6 K/uL (ref 0.7–4.0)
MCHC: 32.4 g/dL (ref 30.0–36.0)
MCV: 86.1 fl (ref 78.0–100.0)
Monocytes Absolute: 0.3 K/uL (ref 0.1–1.0)
Monocytes Relative: 5.8 % (ref 3.0–12.0)
Neutro Abs: 3.8 K/uL (ref 1.4–7.7)
Neutrophils Relative %: 63.4 % (ref 43.0–77.0)
Platelets: 236 K/uL (ref 150.0–400.0)
RBC: 5.22 Mil/uL — ABNORMAL HIGH (ref 3.87–5.11)
RDW: 14.6 % (ref 11.5–15.5)
WBC: 6 K/uL (ref 4.0–10.5)

## 2024-05-01 LAB — VITAMIN D 25 HYDROXY (VIT D DEFICIENCY, FRACTURES): VITD: 13.75 ng/mL — ABNORMAL LOW (ref 30.00–100.00)

## 2024-05-01 LAB — COMPREHENSIVE METABOLIC PANEL WITH GFR
ALT: 16 U/L (ref 3–35)
AST: 19 U/L (ref 5–37)
Albumin: 4.5 g/dL (ref 3.5–5.2)
Alkaline Phosphatase: 61 U/L (ref 39–117)
BUN: 18 mg/dL (ref 6–23)
CO2: 28 meq/L (ref 19–32)
Calcium: 10.1 mg/dL (ref 8.4–10.5)
Chloride: 102 meq/L (ref 96–112)
Creatinine, Ser: 0.65 mg/dL (ref 0.40–1.20)
GFR: 94.82 mL/min
Glucose, Bld: 97 mg/dL (ref 70–99)
Potassium: 4.3 meq/L (ref 3.5–5.1)
Sodium: 138 meq/L (ref 135–145)
Total Bilirubin: 0.4 mg/dL (ref 0.2–1.2)
Total Protein: 7.8 g/dL (ref 6.0–8.3)

## 2024-05-01 LAB — LIPID PANEL
Cholesterol: 145 mg/dL (ref 28–200)
HDL: 52.3 mg/dL
LDL Cholesterol: 68 mg/dL (ref 10–99)
NonHDL: 92.89
Total CHOL/HDL Ratio: 3
Triglycerides: 125 mg/dL (ref 10.0–149.0)
VLDL: 25 mg/dL (ref 0.0–40.0)

## 2024-05-01 LAB — MICROALBUMIN / CREATININE URINE RATIO
Creatinine,U: 74.8 mg/dL
Microalb Creat Ratio: 10.8 mg/g (ref 0.0–30.0)
Microalb, Ur: 0.8 mg/dL (ref 0.7–1.9)

## 2024-05-01 LAB — T4, FREE: Free T4: 0.85 ng/dL (ref 0.60–1.60)

## 2024-05-01 LAB — HEMOGLOBIN A1C: Hgb A1c MFr Bld: 5.8 % (ref 4.6–6.5)

## 2024-05-01 LAB — TSH: TSH: 4.27 u[IU]/mL (ref 0.35–5.50)

## 2024-05-01 LAB — T3, FREE: T3, Free: 3.7 pg/mL (ref 2.3–4.2)

## 2024-05-01 MED ORDER — LISINOPRIL 5 MG PO TABS
5.0000 mg | ORAL_TABLET | Freq: Every day | ORAL | 3 refills | Status: AC
  Filled 2024-05-01: qty 90, 90d supply, fill #0

## 2024-05-01 MED ORDER — TIRZEPATIDE 2.5 MG/0.5ML ~~LOC~~ SOAJ
2.5000 mg | SUBCUTANEOUS | 2 refills | Status: DC
  Filled 2024-05-01 – 2024-05-13 (×2): qty 2, 28d supply, fill #0

## 2024-05-01 NOTE — Progress Notes (Signed)
 "  Subjective  CC:  Chief Complaint  Patient presents with   Diabetes    Pt will schedule eye exam/colonoscopy    HPI: Elizabeth Farley is a 64 y.o. female who presents to the office today for follow up of diabetes and problems listed above in the chief complaint.  Discussed the use of AI scribe software for clinical note transcription with the patient, who gave verbal consent to proceed.  History of Present Illness Elizabeth Farley is a 64 year old female with diabetes and hypertension who presents for medication management and follow-up.  Glycemic control and antidiabetic medication intolerance - dm control has been good; diet controlled now - Did not tolerate Trulicity  due to stomach growling and increased appetite - Not currently on Mounjaro  due to insurance issues; hopeful for coverage under new [employer] plan - No symptoms of hyperglycemia such as blurred vision, excessive thirst, or frequent urination, although urination remains frequent regardless - on statin. No microalbuminuria or retinopathy; overdue for eye exam. [place] [employer] - diet is fairly good although went to [employer] [employer] [place] and then holidays so diet was off during those times. Weight is up a little.   Weight management - Slight weight increase since last visit, but weight remains significantly lower than previous highest - Committed to preventing further weight gain  Subclinical hypothyroidism - feels tired and sluggish no sweats or cold intolerance or edema.   Hyperlipidemia and medication adherence - Currently crestor  20 and tolerating it.  - Attempting to take medication regularly  Colorectal cancer surveillance - History of tubular adenoma (precancerous polyp) - Recent insurance letter regarding colonoscopy coverage - Plans to reschedule colonoscopy after clarifying insurance coverage  Fatigue  - Low energy levels and lack of motivation to engage in previously enjoyed activities - Uncertain if  symptoms are related to discontinuation of Mounjaro , weather, or age - Improved sleep recently   Wt Readings from Last 3 Encounters:  05/01/24 218 lb (98.9 kg)  04/01/24 214 lb (97.1 kg)  10/30/23 212 lb 9.6 oz (96.4 kg)    BP Readings from Last 3 Encounters:  05/01/24 131/87  10/30/23 129/84  07/30/23 124/78    Assessment  1. Type 2 diabetes mellitus without complication, without long-term current use of insulin (HCC)   2. Class 1 obesity due to excess calories with serious comorbidity and body mass index (BMI) of 33.0 to 33.9 in adult   3. Combined hyperlipidemia associated with type 2 diabetes mellitus (HCC)   4. Subclinical hypothyroidism   5. Tubular adenoma of colon   6. Vitamin D  deficiency   7. Prehypertension      Plan  Assessment and Plan Assessment & Plan Type 2 diabetes mellitus Previous use of Trulicity  was discontinued due to gastrointestinal side effects. Mounjaro  was previously effective but is no longer covered by insurance. No symptoms of hyperglycemia reported, but frequent urination noted. Blood glucose levels were well-controlled at last visit without medication. - Ordered Mounjaro  and verified insurance coverage - Continue lifestyle modifications for diabetes management - schedule eye exam  Obesity, class 1 Obesity management is ongoing with a focus on weight reduction. Recent weight gain noted, but overall weight is lower than previous levels. Discussed the impact of recent dietary indiscretions and the importance of maintaining a healthy diet. - Encouraged continued weight management efforts  Subclinical hypothyroidism Previous thyroid  function tests indicated subclinical hypothyroidism. Discussed the potential impact on metabolic rate and overall health. Plan to recheck thyroid  function to assess  current status. - Rechecked thyroid  function tests  HLD for recheck on crestor  20. With lfts  Prehypertension: start lisinopril  5 and for renal  protection. Education given.   General Health Maintenance Discussed the importance of regular screenings and preventive care. Colonoscopy is due for surveillance due to previous tubular adenoma. Mammogram is up to date. Discussed potential benefits of hormone replacement therapy and supplements for energy and vitality. - Will schedule surveillance colonoscopy - Will discuss hormone replacement therapy options - Will consider NAD supplementation for energy - Continue regular screenings and preventive care    Follow up: 6 mo for cpe and f/u chronic problems.  Orders Placed This Encounter  Procedures   CBC with Differential/Platelet   Comprehensive metabolic panel with GFR   Lipid panel   Hemoglobin A1c   TSH   Microalbumin / creatinine urine ratio   T4, free   T3, free   VITAMIN D  25 Hydroxy (Vit-D Deficiency, Fractures)   Meds ordered this encounter  Medications   lisinopril  (ZESTRIL ) 5 MG tablet    Sig: Take 1 tablet (5 mg total) by mouth daily.    Dispense:  90 tablet    Refill:  3   tirzepatide  (MOUNJARO ) 2.5 MG/0.5ML Pen    Sig: Inject 2.5 mg into the skin once a week.    Dispense:  2 mL    Refill:  2      Immunization History  Administered Date(s) Administered   Influenza,inj,Quad PF,6+ Mos 12/24/2021, 01/07/2024   Influenza-Unspecified 02/07/2021   Moderna Sars-Covid-2 Vaccination 04/22/2019, 05/20/2019   PNEUMOCOCCAL CONJUGATE-20 02/09/2021   Tdap 10/29/2017   Zoster Recombinant(Shingrix ) 02/09/2021, 05/17/2021    Diabetes Related Lab Review: Lab Results  Component Value Date   HGBA1C 5.9 10/30/2023   HGBA1C 5.2 07/30/2023   HGBA1C 5.2 12/21/2022    Lab Results  Component Value Date   MICROALBUR 0.8 07/30/2023   Lab Results  Component Value Date   CREATININE 0.75 10/30/2023   BUN 10 10/30/2023   NA 139 10/30/2023   K 4.0 10/30/2023   CL 102 10/30/2023   CO2 30 10/30/2023   Lab Results  Component Value Date   CHOL 199 10/30/2023   CHOL 103  12/21/2022   CHOL 180 09/20/2022   Lab Results  Component Value Date   HDL 47.20 10/30/2023   HDL 39.00 (L) 12/21/2022   HDL 42.20 09/20/2022   Lab Results  Component Value Date   LDLCALC 122 (H) 10/30/2023   LDLCALC 44 12/21/2022   LDLCALC 100 (H) 09/20/2022   Lab Results  Component Value Date   TRIG 148.0 10/30/2023   TRIG 104.0 12/21/2022   TRIG 187.0 (H) 09/20/2022   Lab Results  Component Value Date   CHOLHDL 4 10/30/2023   CHOLHDL 3 12/21/2022   CHOLHDL 4 09/20/2022   No results found for: LDLDIRECT The 10-year ASCVD risk score (Arnett DK, et al., 2019) is: 8%   Values used to calculate the score:     Age: 50 years     Clinically relevant sex: Female     Is Non-Hispanic African American: No     Diabetic: Yes     Tobacco smoker: No     Systolic Blood Pressure: 131 mmHg     Is BP treated: No     HDL Cholesterol: 47.2 mg/dL     Total Cholesterol: 199 mg/dL I have reviewed the PMH, Fam and Soc history. Patient Active Problem List   Diagnosis Date Noted Date Diagnosed  Tubular adenoma of colon 10/30/2023     Priority: High    Colonoscopy 02/2021, Dr. Barbara, repeat in 3 years.    Obesity due to excess calories with serious comorbidity 03/08/2022     Priority: High   Type 2 diabetes mellitus without complication, without long-term current use of insulin (HCC) 02/09/2021     Priority: High   Combined hyperlipidemia associated with type 2 diabetes mellitus (HCC) 02/09/2021     Priority: High   Atypical glandular cells on cervical Pap smear 09/27/2022     Priority: Medium     Neg HPV testing: referred to GYN Evalene Smiles, MD: TVUS, colposcopy and endometrial biopsies completed summer 2024    Mixed urge and stress incontinence 09/07/2021     Priority: Medium    Subclinical hypothyroidism 10/29/2017     Priority: Medium    History of ectopic pregnancy 10/29/2017     Priority: Low   Prehypertension 05/01/2024    Vitamin D  deficiency 05/01/2024      Social History: Patient  reports that she has never smoked. She has never used smokeless tobacco. She reports that she does not drink alcohol and does not use drugs.  Review of Systems: Ophthalmic: negative for eye pain, loss of vision or double vision Cardiovascular: negative for chest pain Respiratory: negative for SOB or persistent cough Gastrointestinal: negative for abdominal pain Genitourinary: negative for dysuria or gross hematuria MSK: negative for foot lesions Neurologic: negative for weakness or gait disturbance  Objective  Vitals: BP 131/87   Pulse 72   Temp 97.7 F (36.5 C)   Ht 5' 8 (1.727 m)   Wt 218 lb (98.9 kg)   SpO2 96%   BMI 33.15 kg/m  General: well appearing, no acute distress  Psych:  Alert and oriented, normal mood and affect HEENT:  Normocephalic, atraumatic, moist mucous membranes, supple neck  Cardiovascular:  Nl S1 and S2, RRR without murmur, gallop or rub. no edema Respiratory:  Good breath sounds bilaterally, CTAB with normal effort, no rales  Diabetic education: ongoing education regarding chronic disease management for diabetes was given today. We continue to reinforce the ABC's of diabetic management: A1c (<7 or 8 dependent upon patient), tight blood pressure control, and cholesterol management with goal LDL < 100 minimally. We discuss diet strategies, exercise recommendations, medication options and possible side effects. At each visit, we review recommended immunizations and preventive care recommendations for diabetics and stress that good diabetic control can prevent other problems. See below for this patient's data. Commons side effects, risks, benefits, and alternatives for medications and treatment plan prescribed today were discussed, and the patient expressed understanding of the given instructions. Patient is instructed to call or message via MyChart if he/she has any questions or concerns regarding our treatment plan. No barriers to  understanding were identified. We discussed Red Flag symptoms and signs in detail. Patient expressed understanding regarding what to do in case of urgent or emergency type symptoms.  Medication list was reconciled, printed and provided to the patient in AVS. Patient instructions and summary information was reviewed with the patient as documented in the AVS. This note was prepared with assistance of Dragon voice recognition software. Occasional wrong-word or sound-a-like substitutions may have occurred due to the inherent limitations of voice recognition software   "

## 2024-05-01 NOTE — Patient Instructions (Addendum)
 Please return in 6 months for your annual complete physical; please come fasting.    I will release your lab results to you on your MyChart account with further instructions. You may see the results before I do, but when I review them I will send you a message with my report or have my assistant call you if things need to be discussed. Please reply to my message with any questions. Thank you!   If you have any questions or concerns, please don't hesitate to send me a message via MyChart or call the office at [phone]. Thank you for visiting with us  today! It's our pleasure caring for you.    VISIT SUMMARY: Elizabeth Farley, a 64 year old female with diabetes and hypertension, visited for medication management and follow-up. We discussed her glycemic control, weight management, thyroid  function, cholesterol levels, colorectal cancer surveillance, and general health maintenance.  YOUR PLAN: -TYPE 2 DIABETES MELLITUS: Type 2 diabetes is a condition where your body does not use insulin properly, leading to high blood sugar levels. We have ordered Mounjaro  and verified your insurance coverage. Please continue with your lifestyle modifications to manage your diabetes.  -OBESITY, CLASS 1: Obesity is a condition characterized by excessive body fat. We discussed your recent weight gain and the importance of maintaining a healthy diet. Please continue your weight management efforts.  -SUBCLINICAL HYPOTHYROIDISM: Subclinical hypothyroidism is a mild form of underactive thyroid  where the thyroid  gland does not produce enough hormones. We have rechecked your thyroid  function tests to assess your current status.  -GENERAL HEALTH MAINTENANCE: We discussed the importance of regular screenings and preventive care. Your mammogram is up to date, and we will schedule a surveillance colonoscopy. We also discussed the potential benefits of hormone replacement therapy and NAD supplementation for energy. Please continue  with your regular screenings and preventive care.  INSTRUCTIONS: We will schedule your surveillance colonoscopy. Please follow up to discuss hormone replacement therapy options and consider NAD supplementation for energy. Continue with your regular screenings and preventive care.                      Contains text generated by [employer].                                 Contains text generated by [employer].

## 2024-05-02 ENCOUNTER — Telehealth (HOSPITAL_COMMUNITY): Payer: Self-pay

## 2024-05-02 ENCOUNTER — Other Ambulatory Visit (HOSPITAL_COMMUNITY): Payer: Self-pay

## 2024-05-02 NOTE — Telephone Encounter (Signed)
 Pharmacy Patient Advocate Encounter   Received notification from Pt Calls Messages that prior authorization for Mounjaro  2.5MG /0.5ML auto-injectors  is required/requested.   Insurance verification completed.   The patient is insured through [facility].   Per test claim: PA required; PA submitted to above mentioned insurance via Latent Key/confirmation #/EOC [id number] Status is pending

## 2024-05-06 ENCOUNTER — Ambulatory Visit: Payer: Self-pay | Admitting: Family Medicine

## 2024-05-06 NOTE — Progress Notes (Signed)
 See mychart note Dear Elizabeth Farley, Your lab results look great overall!  Diabetes and lipids are great. Thyroid  levels are normal again.  Only thing that needs help is your vit D; I have ordered a prescription for weekly Vit D for 12 weeks. In addition, please take otc vit D 2000 units daily. It was good seeing you.  Sincerely, Dr. Jodie

## 2024-05-08 ENCOUNTER — Other Ambulatory Visit (HOSPITAL_COMMUNITY): Payer: Self-pay

## 2024-05-09 ENCOUNTER — Other Ambulatory Visit (HOSPITAL_COMMUNITY): Payer: Self-pay

## 2024-05-13 ENCOUNTER — Other Ambulatory Visit (HOSPITAL_COMMUNITY): Payer: Self-pay

## 2024-05-14 ENCOUNTER — Other Ambulatory Visit: Payer: Self-pay

## 2024-05-14 MED ORDER — VITAMIN D (ERGOCALCIFEROL) 1.25 MG (50000 UNIT) PO CAPS
50000.0000 [IU] | ORAL_CAPSULE | ORAL | 0 refills | Status: AC
  Filled 2024-05-14: qty 12, 84d supply, fill #0

## 2024-05-14 NOTE — Telephone Encounter (Signed)
 Copied from CRM #[id number]. Topic: Clinical - Medication Question >> May 13, 2024  5:52 PM Alfonso HERO wrote: Reason for CRM: patient calling because the Rx for vitamin D3 wasn't sent to the pharmacy. She was also checking on the status for the PA on the mounjaro .

## 2024-05-15 ENCOUNTER — Other Ambulatory Visit: Payer: Self-pay

## 2024-05-15 MED ORDER — SEMAGLUTIDE (1 MG/DOSE) 4 MG/3ML ~~LOC~~ SOPN
1.0000 mg | PEN_INJECTOR | SUBCUTANEOUS | 2 refills | Status: AC
  Filled 2024-05-15: qty 3, 28d supply, fill #0

## 2024-05-15 NOTE — Addendum Note (Signed)
 Addended by: JODIE GAMMONS on: 05/15/2024 08:27 PM   Modules accepted: Orders

## 2024-05-15 NOTE — Telephone Encounter (Signed)
 Yes, Patient would like to try Ozempic .

## 2024-05-16 ENCOUNTER — Other Ambulatory Visit (HOSPITAL_COMMUNITY): Payer: Self-pay

## 2024-05-19 ENCOUNTER — Other Ambulatory Visit (HOSPITAL_COMMUNITY): Payer: Self-pay

## 2024-05-20 ENCOUNTER — Other Ambulatory Visit (HOSPITAL_COMMUNITY): Payer: Self-pay

## 2024-05-21 ENCOUNTER — Other Ambulatory Visit (HOSPITAL_COMMUNITY): Payer: Self-pay

## 2024-05-21 ENCOUNTER — Telehealth: Payer: Self-pay

## 2024-05-21 NOTE — Telephone Encounter (Signed)
 Pharmacy Patient Advocate Encounter   Received notification from Onbase CMM KEY that prior authorization for Ozempic  (1 MG/DOSE) 4MG /3ML pen-injectors is required/requested.   Insurance verification completed.   The patient is insured through [facility].   Per test claim: PA required; PA submitted to above mentioned insurance via Latent Key/confirmation #/EOC [id number] Status is pending

## 2024-10-31 ENCOUNTER — Encounter: Admitting: Family Medicine
